# Patient Record
Sex: Male | Born: 1980 | Race: Black or African American | Hispanic: No | Marital: Single | Smoking: Former smoker
Health system: Southern US, Community
[De-identification: ages and names within clinical notes are randomized; demographics above are authoritative.]

## PROBLEM LIST (undated history)

## (undated) DIAGNOSIS — Z933 Colostomy status: Secondary | ICD-10-CM

## (undated) DIAGNOSIS — K5732 Diverticulitis of large intestine without perforation or abscess without bleeding: Secondary | ICD-10-CM

## (undated) DIAGNOSIS — Z789 Other specified health status: Secondary | ICD-10-CM

---

## 2013-02-13 ENCOUNTER — Ambulatory Visit: Payer: Self-pay | Admitting: Family Medicine

## 2013-03-31 ENCOUNTER — Ambulatory Visit: Payer: Self-pay | Admitting: Emergency Medicine

## 2013-04-06 ENCOUNTER — Ambulatory Visit: Payer: Self-pay | Admitting: Emergency Medicine

## 2022-03-04 ENCOUNTER — Inpatient Hospital Stay
Admission: EM | Admit: 2022-03-04 | Discharge: 2022-03-16 | DRG: 853 | Disposition: A | Discharge status: Home Health | Payer: Commercial Managed Care - PPO | Attending: Surgery | Admitting: Surgery

## 2022-03-04 ENCOUNTER — Emergency Department: Payer: Commercial Managed Care - PPO

## 2022-03-04 ENCOUNTER — Other Ambulatory Visit: Payer: Self-pay

## 2022-03-04 DIAGNOSIS — R1031 Right lower quadrant pain: Secondary | ICD-10-CM | POA: Diagnosis not present

## 2022-03-04 DIAGNOSIS — K469 Unspecified abdominal hernia without obstruction or gangrene: Secondary | ICD-10-CM | POA: Diagnosis present

## 2022-03-04 DIAGNOSIS — N179 Acute kidney failure, unspecified: Secondary | ICD-10-CM | POA: Diagnosis not present

## 2022-03-04 DIAGNOSIS — E876 Hypokalemia: Secondary | ICD-10-CM | POA: Diagnosis not present

## 2022-03-04 DIAGNOSIS — F17210 Nicotine dependence, cigarettes, uncomplicated: Secondary | ICD-10-CM | POA: Diagnosis present

## 2022-03-04 DIAGNOSIS — R1084 Generalized abdominal pain: Principal | ICD-10-CM

## 2022-03-04 DIAGNOSIS — K5720 Diverticulitis of large intestine with perforation and abscess without bleeding: Secondary | ICD-10-CM | POA: Diagnosis present

## 2022-03-04 DIAGNOSIS — K5732 Diverticulitis of large intestine without perforation or abscess without bleeding: Secondary | ICD-10-CM

## 2022-03-04 DIAGNOSIS — E871 Hypo-osmolality and hyponatremia: Secondary | ICD-10-CM | POA: Diagnosis not present

## 2022-03-04 DIAGNOSIS — K567 Ileus, unspecified: Secondary | ICD-10-CM | POA: Diagnosis not present

## 2022-03-04 DIAGNOSIS — K56600 Partial intestinal obstruction, unspecified as to cause: Secondary | ICD-10-CM | POA: Diagnosis present

## 2022-03-04 DIAGNOSIS — R188 Other ascites: Secondary | ICD-10-CM | POA: Diagnosis present

## 2022-03-04 DIAGNOSIS — K562 Volvulus: Secondary | ICD-10-CM | POA: Diagnosis present

## 2022-03-04 DIAGNOSIS — A4151 Sepsis due to Escherichia coli [E. coli]: Secondary | ICD-10-CM | POA: Diagnosis not present

## 2022-03-04 DIAGNOSIS — K651 Peritoneal abscess: Secondary | ICD-10-CM | POA: Diagnosis present

## 2022-03-04 LAB — COMPREHENSIVE METABOLIC PANEL
ALT: 30 U/L (ref 0–44)
AST: 23 U/L (ref 15–41)
Albumin: 4.3 g/dL (ref 3.5–5.0)
Alkaline Phosphatase: 55 U/L (ref 38–126)
Anion gap: 8 (ref 5–15)
BUN: 12 mg/dL (ref 6–20)
CO2: 25 mmol/L (ref 22–32)
Calcium: 9.4 mg/dL (ref 8.9–10.3)
Chloride: 101 mmol/L (ref 98–111)
Creatinine, Ser: 1.04 mg/dL (ref 0.61–1.24)
GFR, Estimated: >60 mL/min (ref >60)
Glucose, Bld: 158 mg/dL — ABNORMAL HIGH (ref 70–99)
Potassium: 3.7 mmol/L (ref 3.5–5.1)
Sodium: 134 mmol/L — ABNORMAL LOW (ref 135–145)
Total Bilirubin: 1.5 mg/dL — ABNORMAL HIGH (ref 0.3–1.2)
Total Protein: 9.1 g/dL — ABNORMAL HIGH (ref 6.5–8.1)

## 2022-03-04 LAB — CBC
HCT: 45.2 % (ref 39.0–52.0)
Hemoglobin: 14.8 g/dL (ref 13.0–17.0)
MCH: 30.4 pg (ref 26.0–34.0)
MCHC: 32.7 g/dL (ref 30.0–36.0)
MCV: 92.8 fL (ref 80.0–100.0)
Platelets: 379 10*3/uL (ref 150–400)
RBC: 4.87 MIL/uL (ref 4.22–5.81)
RDW: 11.6 % (ref 11.5–15.5)
WBC: 17.4 10*3/uL — ABNORMAL HIGH (ref 4.0–10.5)
nRBC: 0 % (ref 0.0–0.2)

## 2022-03-04 LAB — LIPASE, BLOOD: Lipase: 22 U/L (ref 11–51)

## 2022-03-04 MED ORDER — ONDANSETRON HCL 4 MG/2ML IJ SOLN
4.0000 mg | Freq: Once | INTRAMUSCULAR | Status: AC
  Administered 2022-03-04: 4 mg via INTRAVENOUS
  Filled 2022-03-04: qty 2

## 2022-03-04 MED ORDER — MORPHINE SULFATE (PF) 4 MG/ML IV SOLN
4.0000 mg | Freq: Once | INTRAVENOUS | Status: AC
  Administered 2022-03-04: 4 mg via INTRAVENOUS
  Filled 2022-03-04: qty 1

## 2022-03-04 MED ORDER — SODIUM CHLORIDE 0.9 % IV BOLUS
1000.0000 mL | Freq: Once | INTRAVENOUS | Status: AC
  Administered 2022-03-04: 1000 mL via INTRAVENOUS

## 2022-03-04 MED ORDER — IOHEXOL 300 MG/ML  SOLN
100.0000 mL | Freq: Once | INTRAMUSCULAR | Status: AC | PRN
  Administered 2022-03-05: 100 mL via INTRAVENOUS

## 2022-03-04 NOTE — ED Provider Notes (Signed)
? ?[facility] ?Provider Note ? ? ? Event Date/Time  ? First MD Initiated Contact with Patient 03/04/22 2311   ?  (approximate) ? ? ?History  ? ?Abdominal Pain ? ? ?HPI ? ?Erik Johns is a 41 y.o. male  who presents to the ED from home with a chief complaint of abdominal pain x 3 days. Reports generalized abdominal pain without associated nausea, vomiting or diarrhea. Reports constipation x several days. Denies fever, cough, chest pain, shortness of breath, dysuria, testicular pain or swelling.  ? ?  ? ? ?Past Medical History  ?History reviewed. No pertinent past medical history. ? ? ?Active Problem List  ? ?Patient Active Problem List  ? Diagnosis Date Noted  ? Diverticulitis of large intestine with perforation 03/05/2022  ? ? ? ?Past Surgical History  ?History reviewed. No pertinent surgical history. ? ? ?Home Medications  ? ?Prior to Admission medications   ?Not on File  ? ? ? ?Allergies  ?Patient has no known allergies. ? ? ?Family History  ?History reviewed. No pertinent family history. ? ? ?Physical Exam  ?Triage Vital Signs: ?ED Triage Vitals [03/04/22 2210]  ?Enc Vitals Group  ?   BP 122/83  ?   Pulse Rate (!) 113  ?   Resp 17  ?   Temp 99.1 ?F (37.3 ?C)  ?   Temp Source Oral  ?   SpO2 95 %  ?   Weight 230 lb (104.3 kg)  ?   Height 6' (1.829 m)  ?   Head Circumference   ?   Peak Flow   ?   Pain Score 9  ?   Pain Loc   ?   Pain Edu?   ?   Excl. in [place]?   ? ? ?Updated Vital Signs: ?BP (!) 132/110   Pulse (!) 119   Temp 99.1 ?F (37.3 ?C) (Oral)   Resp 20   Ht 6' (1.829 m)   Wt 104.3 kg   SpO2 95%   BMI 31.19 kg/m?  ? ? ?General: Awake, mild distress.  ?CV:  Tachycardic. Good peripheral perfusion.  ?Resp:  Normal effort. CTAB. ?Abd:  Mild diffuse tenderness to palpation without rebound or guarding. No distention.  ?Other:  Appears pale. ? ? ?ED Results / Procedures / Treatments  ?Labs ?(all labs ordered are listed, but only abnormal results are displayed) ?Labs Reviewed   ?COMPREHENSIVE METABOLIC PANEL - Abnormal; Notable for the following components:  ?    Result Value  ? Sodium 134 (*)   ? Glucose, Bld 158 (*)   ? Total Protein 9.1 (*)   ? Total Bilirubin 1.5 (*)   ? All other components within normal limits  ?CBC - Abnormal; Notable for the following components:  ? WBC 17.4 (*)   ? All other components within normal limits  ?URINALYSIS, ROUTINE W REFLEX MICROSCOPIC - Abnormal; Notable for the following components:  ? Color, Urine YELLOW (*)   ? APPearance CLEAR (*)   ? Specific Gravity, Urine >1.046 (*)   ? Hgb urine dipstick MODERATE (*)   ? Leukocytes,Ua SMALL (*)   ? Bacteria, UA RARE (*)   ? All other components within normal limits  ?LIPASE, BLOOD  ?HIV ANTIBODY (ROUTINE TESTING W REFLEX)  ?BASIC METABOLIC PANEL  ?MAGNESIUM  ?CBC WITH DIFFERENTIAL/PLATELET  ? ? ? ?EKG ? ?None ? ? ?RADIOLOGY ?I have independently visualized and interpreted patient's CT as well as noted the radiology interpretation: ? ?CT abd/pel:  Perforated diverticulitis ? ?Official radiology report(s): ?CT Abdomen Pelvis W Contrast ? ?Result Date: 03/05/2022 ?CLINICAL DATA:  Abdominal pain, acute, nonlocalized. EXAM: CT ABDOMEN AND PELVIS WITH CONTRAST TECHNIQUE: Multidetector CT imaging of the abdomen and pelvis was performed using the standard protocol following bolus administration of intravenous contrast. RADIATION DOSE REDUCTION: This exam was performed according to the departmental dose-optimization program which includes automated exposure control, adjustment of the mA and/or kV according to patient size and/or use of iterative reconstruction technique. CONTRAST:  157m OMNIPAQUE IOHEXOL 300 MG/ML  SOLN COMPARISON:  None. FINDINGS: Lower chest: No acute abnormality. Hepatobiliary: No focal liver abnormality is seen. No gallstones, gallbladder wall thickening, or biliary dilatation. Pancreas: Unremarkable Spleen: Unremarkable Adrenals/Urinary Tract: The adrenal glands are unremarkable. The kidneys are  normal in size and position. Multiple cortical hypodensities are seen within the right kidney which are too small to accurately characterize but likely represent multiple tiny cortical cysts. The kidneys are otherwise unremarkable. The bladder is unremarkable. Stomach/Bowel: There is extensive pericolonic inflammatory stranding and marked focal bowel wall thickening involving the proximal sigmoid colon within the left lower quadrant of the abdomen best seen on axial image # 63/2 in keeping with changes of acute diverticulitis. There is a are multiple foci of extraluminal gas seen within the left lower quadrant in keeping with perforated diverticulitis. No loculated pericolonic fluid collection. Trace free fluid within the pelvis. No gross free intraperitoneal gas. No evidence of obstruction. Mild infiltration of the a colonic mesentery and enhancement of the peritoneal lining within the left lower quadrant may reflect changes of developing peritonitis. Stomach, small bowel, and large bowel are otherwise unremarkable. Appendix normal. Vascular/Lymphatic: The abdominal vasculature is unremarkable. Shotty adenopathy within the adjacent sigmoid mesentery is likely reactive in nature. No pathologic adenopathy within the abdomen and pelvis. Reproductive: Prostate is unremarkable. Other: No abdominal wall hernia. Musculoskeletal: No acute bone abnormality. No lytic or blastic bone lesion. Lobulated subcutaneous fluid superficial to the sacrum may reflect the sequela of prior trauma or surgical intervention or may reflect a a developing sacral decubitus wound. IMPRESSION: Perforated sigmoid diverticulitis. Peritoneal enhancement and mesenteric infiltration may reflect changes of developing peritonitis. Small free fluid within the pelvis. No loculated pericolonic fluid collections, gross free intraperitoneal gas, or evidence of obstruction. Lobulated subcutaneous fluid superficial to the sacrum of unclear significance.  Correlation for history of trauma or surgical intervention may be helpful. Alternatively, this may reflect a developing sacral decubitus wound. Electronically Signed   By: AFidela SalisburyM.D.   On: 03/05/2022 00:24   ? ? ?PROCEDURES: ? ?Critical Care performed: No ? ?Procedures ? ? ?MEDICATIONS ORDERED IN ED: ?Medications  ?lactated ringers infusion (125 mL/hr Intravenous New Bag/Given 03/05/22 0218)  ?ketorolac (TORADOL) 30 MG/ML injection 30 mg (30 mg Intravenous Given 03/05/22 0219)  ?HYDROmorphone (DILAUDID) injection 0.5 mg (has no administration in time range)  ?ondansetron (ZOFRAN-ODT) disintegrating tablet 4 mg (has no administration in time range)  ?  Or  ?ondansetron (ZOFRAN) injection 4 mg (has no administration in time range)  ?pantoprazole (PROTONIX) injection 40 mg (40 mg Intravenous Given 03/05/22 0213)  ?enoxaparin (LOVENOX) injection 52.5 mg (has no administration in time range)  ?piperacillin-tazobactam (ZOSYN) IVPB 3.375 g (has no administration in time range)  ?sodium chloride 0.9 % bolus 1,000 mL (0 mLs Intravenous Stopped 03/05/22 0232)  ?ondansetron ([facility] injection 4 mg (4 mg Intravenous Given 03/04/22 2350)  ?morphine (PF) 4 MG/ML injection 4 mg (4 mg Intravenous Given 03/04/22 2352)  ?iohexol (OMNIPAQUE) 300  MG/ML solution 100 mL (100 mLs Intravenous Contrast Given 03/05/22 0006)  ?piperacillin-tazobactam (ZOSYN) IVPB 3.375 g (0 g Intravenous Stopped 03/05/22 0224)  ? ? ? ?IMPRESSION / MDM / ASSESSMENT AND PLAN / ED COURSE  ?I reviewed the triage vital signs and the nursing notes. ?             ?               ?41 year old male here for abdominal pain and constipation. Differential diagnosis includes, but is not limited to, acute appendicitis, renal colic, testicular torsion, urinary tract infection/pyelonephritis, prostatitis,  epididymitis, diverticulitis, small bowel obstruction or ileus, colitis, abdominal aortic aneurysm, gastroenteritis, hernia, etc. I have personally reviewed patient's  records and he does not have any to review other than radiology studies from 2014. ? ?Laboratory results demonstrate leukocytosis WBC 17.4, tbili 1.5. Will administer IV analgesia, antiemetic and obtain CT abd/pel. ? ?Clinical

## 2022-03-04 NOTE — ED Triage Notes (Signed)
Pt states his abd has hurt all weekend but worsened tonight. Pt states he has not had a BM in a few days. Pt denies any urinary symptoms.  ?

## 2022-03-04 NOTE — ED Notes (Signed)
Pt to ED for Generalized cramping abdominal pain that has started 2-3 days ago. Denies N/V/D. Denies urinary symptoms.States he has pain in lower back that wraps around sides. Pt states he has not been able to eat or drink anything due to abdominal pain. Denies recent fevers.  ? ?Pt is A&Ox4.  ?

## 2022-03-05 DIAGNOSIS — R1031 Right lower quadrant pain: Secondary | ICD-10-CM | POA: Diagnosis present

## 2022-03-05 DIAGNOSIS — R188 Other ascites: Secondary | ICD-10-CM | POA: Diagnosis present

## 2022-03-05 DIAGNOSIS — K562 Volvulus: Secondary | ICD-10-CM | POA: Diagnosis present

## 2022-03-05 DIAGNOSIS — F17210 Nicotine dependence, cigarettes, uncomplicated: Secondary | ICD-10-CM | POA: Diagnosis present

## 2022-03-05 DIAGNOSIS — K5720 Diverticulitis of large intestine with perforation and abscess without bleeding: Secondary | ICD-10-CM

## 2022-03-05 DIAGNOSIS — K5732 Diverticulitis of large intestine without perforation or abscess without bleeding: Secondary | ICD-10-CM

## 2022-03-05 DIAGNOSIS — A4151 Sepsis due to Escherichia coli [E. coli]: Secondary | ICD-10-CM | POA: Diagnosis present

## 2022-03-05 DIAGNOSIS — K469 Unspecified abdominal hernia without obstruction or gangrene: Secondary | ICD-10-CM | POA: Diagnosis present

## 2022-03-05 DIAGNOSIS — K651 Peritoneal abscess: Secondary | ICD-10-CM | POA: Diagnosis present

## 2022-03-05 DIAGNOSIS — K56600 Partial intestinal obstruction, unspecified as to cause: Secondary | ICD-10-CM | POA: Diagnosis present

## 2022-03-05 DIAGNOSIS — K567 Ileus, unspecified: Secondary | ICD-10-CM | POA: Diagnosis not present

## 2022-03-05 DIAGNOSIS — E871 Hypo-osmolality and hyponatremia: Secondary | ICD-10-CM | POA: Diagnosis not present

## 2022-03-05 DIAGNOSIS — E876 Hypokalemia: Secondary | ICD-10-CM | POA: Diagnosis not present

## 2022-03-05 DIAGNOSIS — N179 Acute kidney failure, unspecified: Secondary | ICD-10-CM | POA: Diagnosis not present

## 2022-03-05 LAB — CBC WITH DIFFERENTIAL/PLATELET
Abs Immature Granulocytes: 0.24 10*3/uL — ABNORMAL HIGH (ref 0.00–0.07)
Basophils Absolute: 0.1 10*3/uL (ref 0.0–0.1)
Basophils Relative: 0 %
Eosinophils Absolute: 0 10*3/uL (ref 0.0–0.5)
Eosinophils Relative: 0 %
HCT: 42.1 % (ref 39.0–52.0)
Hemoglobin: 13.4 g/dL (ref 13.0–17.0)
Immature Granulocytes: 1 %
Lymphocytes Relative: 7 %
Lymphs Abs: 1.8 10*3/uL (ref 0.7–4.0)
MCH: 30.4 pg (ref 26.0–34.0)
MCHC: 31.8 g/dL (ref 30.0–36.0)
MCV: 95.5 fL (ref 80.0–100.0)
Monocytes Absolute: 1.1 10*3/uL — ABNORMAL HIGH (ref 0.1–1.0)
Monocytes Relative: 4 %
Neutro Abs: 23.4 10*3/uL — ABNORMAL HIGH (ref 1.7–7.7)
Neutrophils Relative %: 88 %
Platelets: 336 10*3/uL (ref 150–400)
RBC: 4.41 MIL/uL (ref 4.22–5.81)
RDW: 11.5 % (ref 11.5–15.5)
Smear Review: NORMAL
WBC: 26.6 10*3/uL — ABNORMAL HIGH (ref 4.0–10.5)
nRBC: 0 % (ref 0.0–0.2)

## 2022-03-05 LAB — BASIC METABOLIC PANEL
Anion gap: 8 (ref 5–15)
BUN: 12 mg/dL (ref 6–20)
CO2: 26 mmol/L (ref 22–32)
Calcium: 8.5 mg/dL — ABNORMAL LOW (ref 8.9–10.3)
Chloride: 101 mmol/L (ref 98–111)
Creatinine, Ser: 1.26 mg/dL — ABNORMAL HIGH (ref 0.61–1.24)
GFR, Estimated: >60 mL/min (ref >60)
Glucose, Bld: 134 mg/dL — ABNORMAL HIGH (ref 70–99)
Potassium: 4.1 mmol/L (ref 3.5–5.1)
Sodium: 135 mmol/L (ref 135–145)

## 2022-03-05 LAB — URINALYSIS, ROUTINE W REFLEX MICROSCOPIC
Bilirubin Urine: NEGATIVE
Glucose, UA: NEGATIVE mg/dL
Ketones, ur: NEGATIVE mg/dL
Nitrite: NEGATIVE
Protein, ur: NEGATIVE mg/dL
Specific Gravity, Urine: >1.046 — ABNORMAL HIGH (ref 1.005–1.030)
WBC, UA: NONE SEEN WBC/hpf (ref 0–5)
pH: 5 (ref 5.0–8.0)

## 2022-03-05 LAB — MAGNESIUM: Magnesium: 2.1 mg/dL (ref 1.7–2.4)

## 2022-03-05 LAB — SURGICAL PCR SCREEN
MRSA, PCR: NEGATIVE
Staphylococcus aureus: NEGATIVE

## 2022-03-05 LAB — HIV ANTIBODY (ROUTINE TESTING W REFLEX): HIV Screen 4th Generation wRfx: NONREACTIVE

## 2022-03-05 MED ORDER — ONDANSETRON HCL 4 MG/2ML IJ SOLN: 4.0000 mg | Freq: Four times a day (QID) | INTRAMUSCULAR | Status: DC | PRN

## 2022-03-05 MED ORDER — LACTATED RINGERS IV SOLN
125.0000 mL/h | INTRAVENOUS | Status: DC
  Administered 2022-03-05 (×2): 125 mL/h via INTRAVENOUS

## 2022-03-05 MED ORDER — MUPIROCIN 2 % EX OINT
1.0000 "application " | TOPICAL_OINTMENT | Freq: Two times a day (BID) | CUTANEOUS | Status: DC
  Filled 2022-03-05: qty 22

## 2022-03-05 MED ORDER — PIPERACILLIN-TAZOBACTAM 3.375 G IVPB 30 MIN
3.3750 g | Freq: Once | INTRAVENOUS | Status: AC
  Administered 2022-03-05: 3.375 g via INTRAVENOUS
  Filled 2022-03-05: qty 50

## 2022-03-05 MED ORDER — HYDROMORPHONE HCL 1 MG/ML IJ SOLN
0.5000 mg | INTRAMUSCULAR | Status: DC | PRN
  Administered 2022-03-06 – 2022-03-11 (×17): 0.5 mg via INTRAVENOUS
  Filled 2022-03-05 (×17): qty 0.5

## 2022-03-05 MED ORDER — ENOXAPARIN SODIUM 60 MG/0.6ML IJ SOSY
0.5000 mg/kg | PREFILLED_SYRINGE | INTRAMUSCULAR | Status: DC
  Administered 2022-03-06 – 2022-03-07 (×2): 52.5 mg via SUBCUTANEOUS
  Filled 2022-03-05 (×2): qty 0.6

## 2022-03-05 MED ORDER — PIPERACILLIN-TAZOBACTAM 3.375 G IVPB
3.3750 g | Freq: Three times a day (TID) | INTRAVENOUS | Status: AC
  Administered 2022-03-05 – 2022-03-12 (×21): 3.375 g via INTRAVENOUS
  Filled 2022-03-05 (×20): qty 50

## 2022-03-05 MED ORDER — ONDANSETRON 4 MG PO TBDP
4.0000 mg | ORAL_TABLET | Freq: Four times a day (QID) | ORAL | Status: DC | PRN
  Filled 2022-03-05: qty 1

## 2022-03-05 MED ORDER — PANTOPRAZOLE SODIUM 40 MG IV SOLR
40.0000 mg | Freq: Every day | INTRAVENOUS | Status: DC
  Administered 2022-03-05 – 2022-03-15 (×12): 40 mg via INTRAVENOUS
  Filled 2022-03-05 (×12): qty 10

## 2022-03-05 MED ORDER — KETOROLAC TROMETHAMINE 30 MG/ML IJ SOLN
30.0000 mg | Freq: Four times a day (QID) | INTRAMUSCULAR | Status: AC
  Administered 2022-03-05 – 2022-03-09 (×17): 30 mg via INTRAVENOUS
  Filled 2022-03-05 (×17): qty 1

## 2022-03-05 MED ORDER — SODIUM CHLORIDE 0.9 % IV SOLN: INTRAVENOUS | Status: DC

## 2022-03-05 MED ORDER — SODIUM CHLORIDE 0.9 % IV BOLUS
1000.0000 mL | Freq: Once | INTRAVENOUS | Status: AC
  Administered 2022-03-05: 1000 mL via INTRAVENOUS

## 2022-03-05 MED ORDER — ACETAMINOPHEN 500 MG PO TABS
1000.0000 mg | ORAL_TABLET | Freq: Four times a day (QID) | ORAL | Status: DC | PRN
Start: 2022-03-05 — End: 2022-03-08
  Administered 2022-03-05 – 2022-03-08 (×6): 1000 mg via ORAL
  Filled 2022-03-05 (×6): qty 2

## 2022-03-05 NOTE — Progress Notes (Signed)
Admission profile updated. ?

## 2022-03-05 NOTE — Progress Notes (Signed)
03/05/2022 ? ?Subjective: ?No acute events.  Patient reports that his pain is somewhat improved.  Denies any nausea or vomiting.  On labwork, his WBC has increased to 26.6 and his Cr has increased to 1.26.   ? ?Vital signs: ?Temp:  [99.1 ?F (37.3 ?C)] 99.1 ?F (37.3 ?C) (03/19 2210) ?Pulse Rate:  [105-121] 110 (03/20 0430) ?Resp:  [17-22] 20 (03/20 0200) ?BP: (115-132)/(69-110) 132/80 (03/20 0430) ?SpO2:  [93 %-97 %] 94 % (03/20 0430) ?Weight:  [104.3 kg] 104.3 kg (03/19 2210)  ? ?Intake/Output: ?No intake/output data recorded. ?  ? ?Physical Exam: ?Constitutional: No acute distress ?Abdomen:  soft, non-distended, with mild tenderness in LLQ.  No worsening since last seen on admission.  No peritonitis. ? ?Labs:  ?Recent Labs  ?  03/04/22 ?2214 03/05/22 ?5366  ?WBC 17.4* 26.6*  ?HGB 14.8 13.4  ?HCT 45.2 42.1  ?PLT 379 336  ? ?Recent Labs  ?  03/04/22 ?2214 03/05/22 ?4403  ?NA 134* 135  ?K 3.7 4.1  ?CL 101 101  ?CO2 25 26  ?GLUCOSE 158* 134*  ?BUN 12 12  ?CREATININE 1.04 1.26*  ?CALCIUM 9.4 8.5*  ? ?No results for input(s): LABPROT, INR in the last 72 hours. ? ?Imaging: ?CT Abdomen Pelvis W Contrast ? ?Result Date: 03/05/2022 ?CLINICAL DATA:  Abdominal pain, acute, nonlocalized. EXAM: CT ABDOMEN AND PELVIS WITH CONTRAST TECHNIQUE: Multidetector CT imaging of the abdomen and pelvis was performed using the standard protocol following bolus administration of intravenous contrast. RADIATION DOSE REDUCTION: This exam was performed according to the departmental dose-optimization program which includes automated exposure control, adjustment of the mA and/or kV according to patient size and/or use of iterative reconstruction technique. CONTRAST:  165m OMNIPAQUE IOHEXOL 300 MG/ML  SOLN COMPARISON:  None. FINDINGS: Lower chest: No acute abnormality. Hepatobiliary: No focal liver abnormality is seen. No gallstones, gallbladder wall thickening, or biliary dilatation. Pancreas: Unremarkable Spleen: Unremarkable Adrenals/Urinary  Tract: The adrenal glands are unremarkable. The kidneys are normal in size and position. Multiple cortical hypodensities are seen within the right kidney which are too small to accurately characterize but likely represent multiple tiny cortical cysts. The kidneys are otherwise unremarkable. The bladder is unremarkable. Stomach/Bowel: There is extensive pericolonic inflammatory stranding and marked focal bowel wall thickening involving the proximal sigmoid colon within the left lower quadrant of the abdomen best seen on axial image # 63/2 in keeping with changes of acute diverticulitis. There is a are multiple foci of extraluminal gas seen within the left lower quadrant in keeping with perforated diverticulitis. No loculated pericolonic fluid collection. Trace free fluid within the pelvis. No gross free intraperitoneal gas. No evidence of obstruction. Mild infiltration of the a colonic mesentery and enhancement of the peritoneal lining within the left lower quadrant may reflect changes of developing peritonitis. Stomach, small bowel, and large bowel are otherwise unremarkable. Appendix normal. Vascular/Lymphatic: The abdominal vasculature is unremarkable. Shotty adenopathy within the adjacent sigmoid mesentery is likely reactive in nature. No pathologic adenopathy within the abdomen and pelvis. Reproductive: Prostate is unremarkable. Other: No abdominal wall hernia. Musculoskeletal: No acute bone abnormality. No lytic or blastic bone lesion. Lobulated subcutaneous fluid superficial to the sacrum may reflect the sequela of prior trauma or surgical intervention or may reflect a a developing sacral decubitus wound. IMPRESSION: Perforated sigmoid diverticulitis. Peritoneal enhancement and mesenteric infiltration may reflect changes of developing peritonitis. Small free fluid within the pelvis. No loculated pericolonic fluid collections, gross free intraperitoneal gas, or evidence of obstruction. Lobulated subcutaneous  fluid superficial  to the sacrum of unclear significance. Correlation for history of trauma or surgical intervention may be helpful. Alternatively, this may reflect a developing sacral decubitus wound. Electronically Signed   By: Fidela Salisbury M.D.   On: 03/05/2022 00:24   ? ?Assessment/Plan: ?This is a 41 y.o. male with perforated diverticulitis with small bubbles of gas. ? ?--Clinically patient feels better with mildly improved pain rather than worsening given his increased WBC.  For now, will continue our plan of conservative measures.  Will give 1L NS bolus given his increased Cr to hydrate him better.   ?--Continue NPO for now, IV fluids, IV abx.  Discussed with him the possibility of clear liquids tonight if he continues to improve clinically. ?--GI/DVT prophylaxis. ? ? ?I spent 25 minutes dedicated to the care of this patient on the date of this encounter to include pre-visit review of records, face-to-face time with the patient discussing diagnosis and management, and any post-visit coordination of care. ? ?Melvyn Neth, MD ?[facility]  ?

## 2022-03-05 NOTE — Progress Notes (Signed)
Anticoagulation monitoring(Lovenox): ? ?41 yo  male ordered Lovenox 40 mg Q24h ?   ?Filed Weights  ? 03/04/22 2210  ?Weight: 104.3 kg (230 lb)  ? ?BMI 31  ? ?Lab Results  ?Component Value Date  ? CREATININE 1.04 03/04/2022  ? ?Estimated Creatinine Clearance: 119.1 mL/min (by C-G formula based on SCr of 1.04 mg/dL). ?Hemoglobin & Hematocrit  ?   ?Component Value Date/Time  ? HGB 14.8 03/04/2022 2214  ? HCT 45.2 03/04/2022 2214  ? ? ? ?Per Protocol for Patient with estCrcl > 30 ml/min and BMI > 30, will transition to Lovenox 52.5  mg Q24h.  ?  ? ? ?

## 2022-03-05 NOTE — Progress Notes (Signed)
?   03/05/22 1123  ?Assess: MEWS Score  ?Temp (!) 102.9 ?F (39.4 ?C)  ?BP 121/81  ?Pulse Rate (!) 130  ?Resp 20  ?SpO2 96 %  ?Assess: MEWS Score  ?MEWS Temp 2  ?MEWS Systolic 0  ?MEWS Pulse 3  ?MEWS RR 0  ?MEWS LOC 0  ?MEWS Score 5  ?MEWS Score Color Red  ?Assess: if the MEWS score is Yellow or Red  ?Were vital signs taken at a resting state? No  ?Focused Assessment No change from prior assessment  ?Does the patient meet 2 or more of the SIRS criteria? No  ?MEWS guidelines implemented *See Row Information* Yes  ?Treat  ?MEWS Interventions Administered prn meds/treatments  ?Pain Scale 0-10  ?Pain Score 3  ?Pain Type Acute pain  ?Pain Location Abdomen  ?Pain Orientation Left  ?Pain Descriptors / Indicators Aching  ?Pain Onset With Activity  ?Take Vital Signs  ?Increase Vital Sign Frequency  Red: Q 1hr X 4 then Q 4hr X 4, if remains red, continue Q 4hrs  ?Escalate  ?MEWS: Escalate Red: discuss with charge nurse/RN and provider, consider discussing with RRT  ?Notify: Charge Nurse/RN  ?Name of Charge Nurse/RN Notified Ladon Vandenberghe/  ?Date Charge Nurse/RN Notified 03/05/22  ?Time Charge Nurse/RN Notified 1123  ?Notify: Provider  ?Provider Name/Title Dr. Hampton Abbot  ?Date Provider Notified 03/05/22  ?Time Provider Notified 1130  ?Notification Type Page  ?Notification Reason Change in status  ?Provider response See new orders  ?Date of Provider Response 03/05/22  ?Time of Provider Response 1135  ?Notify: Rapid Response  ?Name of Rapid Response RN Notified Clarise Cruz  ?Date Rapid Response Notified 03/05/22  ?Time Rapid Response Notified 1130  ?Assess: SIRS CRITERIA  ?SIRS Temperature  1  ?SIRS Pulse 1  ?SIRS Respirations  0  ?SIRS WBC 0  ?SIRS Score Sum  2  ? ?Tylenol given and fluids started vitals to be retaken at 1230 ?

## 2022-03-05 NOTE — H&P (Signed)
?Date of Admission:  03/05/2022 ? ?Reason for Admission:  Perforated sigmoid diverticulitis ? ?History of Present Illness: ?Erik Johns is a 41 y.o. male presenting with a 3 day history of generalized cramping abdominal pain.  The patient noticed some mild pain in the left lower quadrant starting the night of 03/02/22.  He went to work and thought that he might be constipated.  He has had some issues with constipation in the past and taking a laxative has helped.  He had liquid bowel movements but his pain did not resolve.  On 3/18, the pain remained similar, but on the night of 3/19, his pain became worse.  He went to work again, but ended up coming to the ER due to worsening pain.  Denies any fevers but reports feeling chills.  Denies any nausea or vomiting, chest pain, shortness of breath.  In the ER, his workup showed a WBC of 17.4.  He had a CT scan of abdomen/pelvis which showed perforated sigmoid diverticulitis, with inflammatory changes of the sigmoid colon with a few bubbles of gas in pericolonic region.  No diffuse free air, no abscess.  I have personally viewed the images.  The patient denies any prior episodes. ? ?Past Medical History: ?--No past medical history  ? ?Past Surgical History: ?--No past surgical history ? ?Home Medications: ?Prior to Admission medications   ?None  ? ? ?Allergies: ?No Known Allergies ? ?Social History: ? reports that he has been smoking cigarettes. He has been smoking an average of .5 packs per day. He has never used smokeless tobacco. He reports current alcohol use. He reports that he does not use drugs.  ? ?Family History: ?History reviewed. No pertinent family history. ? ?Review of Systems: ?Review of Systems  ?Constitutional:  Positive for chills. Negative for fever.  ?HENT:  Negative for hearing loss.   ?Respiratory:  Negative for shortness of breath.   ?Cardiovascular:  Negative for chest pain.  ?Gastrointestinal:  Positive for abdominal pain and constipation.  Negative for diarrhea, nausea and vomiting.  ?Genitourinary:  Negative for dysuria.  ?Musculoskeletal:  Negative for myalgias.  ?Skin:  Negative for rash.  ?Neurological:  Negative for dizziness.  ?Psychiatric/Behavioral:  Negative for depression.   ? ?Physical Exam ?BP (!) 127/95 (BP Location: Right Arm)   Pulse (!) 105   Temp 99.1 ?F (37.3 ?C) (Oral)   Resp (!) 22   Ht 6' (1.829 m)   Wt 104.3 kg   SpO2 97%   BMI 31.19 kg/m?  ?CONSTITUTIONAL: No acute distress, well nourished. ?HEENT:  Normocephalic, atraumatic, extraocular motion intact. ?NECK: Trachea is midline, and there is no jugular venous distension.  ?RESPIRATORY:  Normal respiratory effort without pathologic use of accessory muscles. ?CARDIOVASCULAR: Regular rhythm and rate. ?GI: The abdomen is soft, nondistended, with tenderness to palpation in the left lower quadrant.  No peritonitis.  ?MUSCULOSKELETAL:  Normal muscle strength and tone in all four extremities.  No peripheral edema or cyanosis. ?SKIN: Skin turgor is normal. There are no pathologic skin lesions.  ?NEUROLOGIC:  Motor and sensation is grossly normal.  Cranial nerves are grossly intact. ?PSYCH:  Alert and oriented to person, place and time. Affect is normal. ? ?Laboratory Analysis: ?Results for orders placed or performed during the hospital encounter of 03/04/22 (from the past 24 hour(s))  ?Lipase, blood     Status: None  ? Collection Time: 03/04/22 10:14 PM  ?Result Value Ref Range  ? Lipase 22 11 - 51 U/L  ?Comprehensive metabolic panel  Status: Abnormal  ? Collection Time: 03/04/22 10:14 PM  ?Result Value Ref Range  ? Sodium 134 (L) 135 - 145 mmol/L  ? Potassium 3.7 3.5 - 5.1 mmol/L  ? Chloride 101 98 - 111 mmol/L  ? CO2 25 22 - 32 mmol/L  ? Glucose, Bld 158 (H) 70 - 99 mg/dL  ? BUN 12 6 - 20 mg/dL  ? Creatinine, Ser 1.04 0.61 - 1.24 mg/dL  ? Calcium 9.4 8.9 - 10.3 mg/dL  ? Total Protein 9.1 (H) 6.5 - 8.1 g/dL  ? Albumin 4.3 3.5 - 5.0 g/dL  ? AST 23 15 - 41 U/L  ? ALT 30 0 - 44  U/L  ? Alkaline Phosphatase 55 38 - 126 U/L  ? Total Bilirubin 1.5 (H) 0.3 - 1.2 mg/dL  ? GFR, Estimated >60 >60 mL/min  ? Anion gap 8 5 - 15  ?CBC     Status: Abnormal  ? Collection Time: 03/04/22 10:14 PM  ?Result Value Ref Range  ? WBC 17.4 (H) 4.0 - 10.5 K/uL  ? RBC 4.87 4.22 - 5.81 MIL/uL  ? Hemoglobin 14.8 13.0 - 17.0 g/dL  ? HCT 45.2 39.0 - 52.0 %  ? MCV 92.8 80.0 - 100.0 fL  ? MCH 30.4 26.0 - 34.0 pg  ? MCHC 32.7 30.0 - 36.0 g/dL  ? RDW 11.6 11.5 - 15.5 %  ? Platelets 379 150 - 400 K/uL  ? nRBC 0.0 0.0 - 0.2 %  ? ? ?Imaging: ?CT Abdomen Pelvis W Contrast ? ?Result Date: 03/05/2022 ?CLINICAL DATA:  Abdominal pain, acute, nonlocalized. EXAM: CT ABDOMEN AND PELVIS WITH CONTRAST TECHNIQUE: Multidetector CT imaging of the abdomen and pelvis was performed using the standard protocol following bolus administration of intravenous contrast. RADIATION DOSE REDUCTION: This exam was performed according to the departmental dose-optimization program which includes automated exposure control, adjustment of the mA and/or kV according to patient size and/or use of iterative reconstruction technique. CONTRAST:  113m OMNIPAQUE IOHEXOL 300 MG/ML  SOLN COMPARISON:  None. FINDINGS: Lower chest: No acute abnormality. Hepatobiliary: No focal liver abnormality is seen. No gallstones, gallbladder wall thickening, or biliary dilatation. Pancreas: Unremarkable Spleen: Unremarkable Adrenals/Urinary Tract: The adrenal glands are unremarkable. The kidneys are normal in size and position. Multiple cortical hypodensities are seen within the right kidney which are too small to accurately characterize but likely represent multiple tiny cortical cysts. The kidneys are otherwise unremarkable. The bladder is unremarkable. Stomach/Bowel: There is extensive pericolonic inflammatory stranding and marked focal bowel wall thickening involving the proximal sigmoid colon within the left lower quadrant of the abdomen best seen on axial image # 63/2 in  keeping with changes of acute diverticulitis. There is a are multiple foci of extraluminal gas seen within the left lower quadrant in keeping with perforated diverticulitis. No loculated pericolonic fluid collection. Trace free fluid within the pelvis. No gross free intraperitoneal gas. No evidence of obstruction. Mild infiltration of the a colonic mesentery and enhancement of the peritoneal lining within the left lower quadrant may reflect changes of developing peritonitis. Stomach, small bowel, and large bowel are otherwise unremarkable. Appendix normal. Vascular/Lymphatic: The abdominal vasculature is unremarkable. Shotty adenopathy within the adjacent sigmoid mesentery is likely reactive in nature. No pathologic adenopathy within the abdomen and pelvis. Reproductive: Prostate is unremarkable. Other: No abdominal wall hernia. Musculoskeletal: No acute bone abnormality. No lytic or blastic bone lesion. Lobulated subcutaneous fluid superficial to the sacrum may reflect the sequela of prior trauma or surgical intervention or may reflect  a a developing sacral decubitus wound. IMPRESSION: Perforated sigmoid diverticulitis. Peritoneal enhancement and mesenteric infiltration may reflect changes of developing peritonitis. Small free fluid within the pelvis. No loculated pericolonic fluid collections, gross free intraperitoneal gas, or evidence of obstruction. Lobulated subcutaneous fluid superficial to the sacrum of unclear significance. Correlation for history of trauma or surgical intervention may be helpful. Alternatively, this may reflect a developing sacral decubitus wound. Electronically Signed   By: Fidela Salisbury M.D.   On: 03/05/2022 00:24   ? ?Assessment and Plan: ?This is a 41 y.o. male with perforated sigmoid diverticulitis. ? ?- Discussed with the patient the findings on the lab work and imaging tonight.  CT scan does show perforated sigmoid diverticulitis with bubbles of free air in the adjacent area  however not diffusely spread.  Discussed with him the different degrees of diverticulitis ranging from only inflammatory changes to frank perforation with spillage of stool contents which requires emergency surgery.  Also

## 2022-03-06 ENCOUNTER — Inpatient Hospital Stay: Payer: Commercial Managed Care - PPO

## 2022-03-06 DIAGNOSIS — K5720 Diverticulitis of large intestine with perforation and abscess without bleeding: Secondary | ICD-10-CM | POA: Diagnosis not present

## 2022-03-06 LAB — CBC
HCT: 37.1 % — ABNORMAL LOW (ref 39.0–52.0)
Hemoglobin: 12.2 g/dL — ABNORMAL LOW (ref 13.0–17.0)
MCH: 30.9 pg (ref 26.0–34.0)
MCHC: 32.9 g/dL (ref 30.0–36.0)
MCV: 93.9 fL (ref 80.0–100.0)
Platelets: 278 10*3/uL (ref 150–400)
RBC: 3.95 MIL/uL — ABNORMAL LOW (ref 4.22–5.81)
RDW: 11.8 % (ref 11.5–15.5)
WBC: 28.1 10*3/uL — ABNORMAL HIGH (ref 4.0–10.5)
nRBC: 0 % (ref 0.0–0.2)

## 2022-03-06 LAB — BASIC METABOLIC PANEL
Anion gap: 7 (ref 5–15)
BUN: 17 mg/dL (ref 6–20)
CO2: 23 mmol/L (ref 22–32)
Calcium: 8.2 mg/dL — ABNORMAL LOW (ref 8.9–10.3)
Chloride: 107 mmol/L (ref 98–111)
Creatinine, Ser: 1.39 mg/dL — ABNORMAL HIGH (ref 0.61–1.24)
GFR, Estimated: >60 mL/min (ref >60)
Glucose, Bld: 117 mg/dL — ABNORMAL HIGH (ref 70–99)
Potassium: 3.3 mmol/L — ABNORMAL LOW (ref 3.5–5.1)
Sodium: 137 mmol/L (ref 135–145)

## 2022-03-06 LAB — MAGNESIUM: Magnesium: 2 mg/dL (ref 1.7–2.4)

## 2022-03-06 MED ORDER — POTASSIUM CHLORIDE 20 MEQ PO PACK
40.0000 meq | PACK | Freq: Two times a day (BID) | ORAL | Status: DC
  Administered 2022-03-06 – 2022-03-07 (×4): 40 meq via ORAL
  Filled 2022-03-06 (×4): qty 2

## 2022-03-06 MED ORDER — SODIUM CHLORIDE 0.9 % IV BOLUS
1000.0000 mL | Freq: Once | INTRAVENOUS | Status: AC
  Administered 2022-03-06: 1000 mL via INTRAVENOUS

## 2022-03-06 MED ORDER — ALBUMIN HUMAN 25 % IV SOLN
25.0000 g | Freq: Once | INTRAVENOUS | Status: AC
  Administered 2022-03-06: 25 g via INTRAVENOUS
  Filled 2022-03-06: qty 100

## 2022-03-06 NOTE — TOC Initial Note (Signed)
?  Transition of Care (TOC) Screening Note ? ? ?Patient Details  ?Name: Erik Johns ?Date of Birth: 01-05-1981 ? ? ?Transition of Care (TOC) CM/SW Contact:    ?Kerin Salen, RN ?Phone Number: ?03/06/2022, 9:21 AM ? ? ? ?Transition of Care Department [facility]) has reviewed patient and no TOC needs have been identified at this time. We will continue to monitor patient advancement through interdisciplinary progression rounds. If new patient transition needs arise, please place a TOC consult. ?              ? ? ?  ?  ? ? ?Patient Goals and CMS Choice ?  ?  ?  ? ?Expected Discharge Plan and Services ?  ?  ?  ?  ?  ?                ?  ?  ?  ?  ?  ?  ?  ?  ?  ?  ? ?Prior Living Arrangements/Services ?  ?  ?  ?       ?  ?  ?  ?  ? ?Activities of Daily Living ?Home Assistive Devices/Equipment: None ?ADL Screening (condition at time of admission) ?Patient's cognitive ability adequate to safely complete daily activities?: Yes ?Is the patient deaf or have difficulty hearing?: No ?Does the patient have difficulty seeing, even when wearing glasses/contacts?: No ?Does the patient have difficulty concentrating, remembering, or making decisions?: No ?Patient able to express need for assistance with ADLs?: Yes ?Does the patient have difficulty dressing or bathing?: No ?Independently performs ADLs?: Yes (appropriate for developmental age) ?Does the patient have difficulty walking or climbing stairs?: No ?Weakness of Legs: None ?Weakness of Arms/Hands: None ? ?Permission Sought/Granted ?  ?  ?   ?   ?   ?   ? ?Emotional Assessment ?  ?  ?  ?  ?  ?  ? ?Admission diagnosis:  Generalized abdominal pain [R10.84] ?Diverticulitis of colon with perforation [K57.20] ?Diverticulitis of large intestine with perforation [K57.20] ?Patient Active Problem List  ? Diagnosis Date Noted  ? Diverticulitis of colon with perforation 03/05/2022  ? ?PCP:  Pcp, No ?Pharmacy:  No Pharmacies Listed ? ? ? ?Social Determinants of Health (SDOH) Interventions ?   ? ?Readmission Risk Interventions ?No flowsheet data found. ? ? ?

## 2022-03-06 NOTE — Progress Notes (Signed)
?   03/06/22 0401  ?Assess: MEWS Score  ?Temp (!) 100.7 ?F (38.2 ?C)  ?BP 130/81  ?Pulse Rate (!) 120  ?Resp 20  ?SpO2 95 %  ?O2 Device Room Air  ?Assess: MEWS Score  ?MEWS Temp 1  ?MEWS Systolic 0  ?MEWS Pulse 2  ?MEWS RR 0  ?MEWS LOC 0  ?MEWS Score 3  ?MEWS Score Color Yellow  ?Assess: if the MEWS score is Yellow or Red  ?Were vital signs taken at a resting state? Yes  ?Does the patient meet 2 or more of the SIRS criteria? Yes  ?Does the patient have a confirmed or suspected source of infection? Yes  ?Provider and Rapid Response Notified? No  ?MEWS guidelines implemented *See Row Information* No, previously yellow, continue vital signs every 4 hours  ?Assess: SIRS CRITERIA  ?SIRS Temperature  0  ?SIRS Pulse 1  ?SIRS Respirations  0  ?SIRS WBC 0  ?SIRS Score Sum  1  ? ? ?

## 2022-03-06 NOTE — Progress Notes (Signed)
Dr. Christian Mate notified for yellow MEWS as per MEWS policy; order written for NPO except for meds and Tylenol administered. Barbaraann Faster, RN 4:42 AM 03/06/2022  ?

## 2022-03-06 NOTE — Progress Notes (Signed)
CC: Diverticulitis ?Subjective: ?He feels better today. ?Did have a fever and was given saline bolus. WBC up to 28k ?Has been tachycardic. ?Definitely looking at his numbers he looks sicker on paper than on clinical exam. ?He was very honest with me and said that today he was able to lean forward and stand up.  He said the day before yesterday he was not able to do that.  He continues to have watery bowel movements. ?He is note that I have personally reviewed the KUB as well as the CT scan of the abdomen and pelvis.  There is a contained area of perforation in the sigmoid colon.  KUB this morning showed no evidence of free air there is some air-fluid levels consistent with ileus ? ?Objective: ?Vital signs in last 24 hours: ?Temp:  [98.7 ?F (37.1 ?C)-102.9 ?F (39.4 ?C)] 99.3 ?F (37.4 ?C) (03/21 0726) ?Pulse Rate:  [107-130] 107 (03/21 0726) ?Resp:  [16-20] 18 (03/21 0726) ?BP: (118-132)/(74-86) 132/86 (03/21 0726) ?SpO2:  [95 %-100 %] 95 % (03/21 0726) ?Last BM Date : 03/05/22 ? ?Intake/Output from previous day: ?03/20 0701 - 03/21 0700 ?In: 3867.2 [I.V.:3086.8; IV Piggyback:780.4] ?Out: -  ?Intake/Output this shift: ?Total I/O ?In: -  ?Out: 175 [Urine:175] ? ?Physical exam: ? ?NAD alert ?Abd: soft, mild TTP w/o rebound , no peritonitis ?Ext; well perfused w/o edema ? ?Lab Results: ?CBC  ?Recent Labs  ?  03/05/22 ?8250 03/06/22 ?5397  ?WBC 26.6* 28.1*  ?HGB 13.4 12.2*  ?HCT 42.1 37.1*  ?PLT 336 278  ? ?BMET ?Recent Labs  ?  03/05/22 ?6734 03/06/22 ?1937  ?NA 135 137  ?K 4.1 3.3*  ?CL 101 107  ?CO2 26 23  ?GLUCOSE 134* 117*  ?BUN 12 17  ?CREATININE 1.26* 1.39*  ?CALCIUM 8.5* 8.2*  ? ?PT/INR ?No results for input(s): LABPROT, INR in the last 72 hours. ?ABG ?No results for input(s): PHART, HCO3 in the last 72 hours. ? ?Invalid input(s): PCO2, PO2 ? ?Studies/Results: ?CT Abdomen Pelvis W Contrast ? ?Result Date: 03/05/2022 ?CLINICAL DATA:  Abdominal pain, acute, nonlocalized. EXAM: CT ABDOMEN AND PELVIS WITH CONTRAST  TECHNIQUE: Multidetector CT imaging of the abdomen and pelvis was performed using the standard protocol following bolus administration of intravenous contrast. RADIATION DOSE REDUCTION: This exam was performed according to the departmental dose-optimization program which includes automated exposure control, adjustment of the mA and/or kV according to patient size and/or use of iterative reconstruction technique. CONTRAST:  145m OMNIPAQUE IOHEXOL 300 MG/ML  SOLN COMPARISON:  None. FINDINGS: Lower chest: No acute abnormality. Hepatobiliary: No focal liver abnormality is seen. No gallstones, gallbladder wall thickening, or biliary dilatation. Pancreas: Unremarkable Spleen: Unremarkable Adrenals/Urinary Tract: The adrenal glands are unremarkable. The kidneys are normal in size and position. Multiple cortical hypodensities are seen within the right kidney which are too small to accurately characterize but likely represent multiple tiny cortical cysts. The kidneys are otherwise unremarkable. The bladder is unremarkable. Stomach/Bowel: There is extensive pericolonic inflammatory stranding and marked focal bowel wall thickening involving the proximal sigmoid colon within the left lower quadrant of the abdomen best seen on axial image # 63/2 in keeping with changes of acute diverticulitis. There is a are multiple foci of extraluminal gas seen within the left lower quadrant in keeping with perforated diverticulitis. No loculated pericolonic fluid collection. Trace free fluid within the pelvis. No gross free intraperitoneal gas. No evidence of obstruction. Mild infiltration of the a colonic mesentery and enhancement of the peritoneal lining within the left lower  quadrant may reflect changes of developing peritonitis. Stomach, small bowel, and large bowel are otherwise unremarkable. Appendix normal. Vascular/Lymphatic: The abdominal vasculature is unremarkable. Shotty adenopathy within the adjacent sigmoid mesentery is likely  reactive in nature. No pathologic adenopathy within the abdomen and pelvis. Reproductive: Prostate is unremarkable. Other: No abdominal wall hernia. Musculoskeletal: No acute bone abnormality. No lytic or blastic bone lesion. Lobulated subcutaneous fluid superficial to the sacrum may reflect the sequela of prior trauma or surgical intervention or may reflect a a developing sacral decubitus wound. IMPRESSION: Perforated sigmoid diverticulitis. Peritoneal enhancement and mesenteric infiltration may reflect changes of developing peritonitis. Small free fluid within the pelvis. No loculated pericolonic fluid collections, gross free intraperitoneal gas, or evidence of obstruction. Lobulated subcutaneous fluid superficial to the sacrum of unclear significance. Correlation for history of trauma or surgical intervention may be helpful. Alternatively, this may reflect a developing sacral decubitus wound. Electronically Signed   By: Fidela Salisbury M.D.   On: 03/05/2022 00:24  ? ?DG ABD ACUTE 2+V W 1V CHEST ? ?Result Date: 03/06/2022 ?CLINICAL DATA:  Abdominal pain EXAM: DG ABDOMEN ACUTE WITH 1 VIEW CHEST COMPARISON:  None. FINDINGS: Multiple dilated loops of small bowel with air-fluid level seen on upright image. Small amount gas seen in the large bowel. No radiopaque calculi or other significant radiographic abnormality is seen. Heart size and mediastinal contours are within normal limits. Low lung volumes with hypoventilatory changes. IMPRESSION: Multiple dilated loops of small bowel with air-fluid level seen on upright image, findings are concerning for small bowel obstruction. These results will be called to the ordering clinician or representative by the Radiologist Assistant, and communication documented in the PACS or [employer]. Electronically Signed   By: Yetta Glassman M.D.   On: 03/06/2022 08:20   ? ?Anti-infectives: ?Anti-infectives (From admission, onward)  ? ? Start     Dose/Rate Route Frequency Ordered  Stop  ? 03/05/22 0700  piperacillin-tazobactam (ZOSYN) IVPB 3.375 g       ? 3.375 g ?12.5 mL/hr over 240 Minutes Intravenous Every 8 hours 03/05/22 0051 03/12/22 0759  ? 03/05/22 0045  piperacillin-tazobactam (ZOSYN) IVPB 3.375 g       ? 3.375 g ?100 mL/hr over 30 Minutes Intravenous  Once 03/05/22 0041 03/05/22 0224  ? ?  ? ? ?Assessment/Plan: ? ?Diverticulitis with contained perforation.  He is not clinically toxic nor peritonitic.  His numbers, laboratory values may indicate otherwise.  There is significant discrepancy between lab findings and clinical findings.  This is likely the result of a delayed Molap findings as compared to clinical live assessment. ?AB this morning does not show any free air but just some ileus.  He is very thirsty and is begging me to give him some clears.  I will be okay with only sips of clears.  He is getting aggressive fluid resuscitation with both crystalloids and albumin.  Discussed with nursing staff and also with the patient about tighter and more stricter ins and outs measurements. ?We will likely repeat imaging with CT in 24 to 48 hours depending on clinical condition. Order placed for Thursday  ?At this point does not require emergent surgical intervention.  He understands that he may deteriorate and may require Hartman's procedure.  I do not think that he is quite there yet. ?Continue A/bs  ?Please note that I spent greater than 55 minutes in this encounter including personally reviewing imaging studies, placing orders, coordinating his care and counseling the patient as well as performing appropriate  documentation ? ? ?Caroleen Hamman, MD, FACS ? ?03/06/2022 ? ? ? ?  ?

## 2022-03-06 NOTE — Progress Notes (Signed)
See new orders; blood cultures X 2 obtained and bolus infusing. Barbaraann Faster, RN 5:43 AM 03/06/2022  ?

## 2022-03-07 DIAGNOSIS — K5720 Diverticulitis of large intestine with perforation and abscess without bleeding: Secondary | ICD-10-CM | POA: Diagnosis not present

## 2022-03-07 LAB — CBC
HCT: 37.5 % — ABNORMAL LOW (ref 39.0–52.0)
Hemoglobin: 12.3 g/dL — ABNORMAL LOW (ref 13.0–17.0)
MCH: 30.1 pg (ref 26.0–34.0)
MCHC: 32.8 g/dL (ref 30.0–36.0)
MCV: 91.9 fL (ref 80.0–100.0)
Platelets: 285 10*3/uL (ref 150–400)
RBC: 4.08 MIL/uL — ABNORMAL LOW (ref 4.22–5.81)
RDW: 11.9 % (ref 11.5–15.5)
WBC: 13.4 10*3/uL — ABNORMAL HIGH (ref 4.0–10.5)
nRBC: 0 % (ref 0.0–0.2)

## 2022-03-07 LAB — COMPREHENSIVE METABOLIC PANEL
ALT: 18 U/L (ref 0–44)
AST: 17 U/L (ref 15–41)
Albumin: 3 g/dL — ABNORMAL LOW (ref 3.5–5.0)
Alkaline Phosphatase: 34 U/L — ABNORMAL LOW (ref 38–126)
Anion gap: 8 (ref 5–15)
BUN: 16 mg/dL (ref 6–20)
CO2: 20 mmol/L — ABNORMAL LOW (ref 22–32)
Calcium: 7.7 mg/dL — ABNORMAL LOW (ref 8.9–10.3)
Chloride: 111 mmol/L (ref 98–111)
Creatinine, Ser: 0.97 mg/dL (ref 0.61–1.24)
GFR, Estimated: >60 mL/min (ref >60)
Glucose, Bld: 116 mg/dL — ABNORMAL HIGH (ref 70–99)
Potassium: 3.4 mmol/L — ABNORMAL LOW (ref 3.5–5.1)
Sodium: 139 mmol/L (ref 135–145)
Total Bilirubin: 2.4 mg/dL — ABNORMAL HIGH (ref 0.3–1.2)
Total Protein: 6.8 g/dL (ref 6.5–8.1)

## 2022-03-07 LAB — GLUCOSE, CAPILLARY: Glucose-Capillary: 147 mg/dL — ABNORMAL HIGH (ref 70–99)

## 2022-03-07 MED ORDER — CYCLOBENZAPRINE HCL 10 MG PO TABS
5.0000 mg | ORAL_TABLET | Freq: Three times a day (TID) | ORAL | Status: DC | PRN
  Administered 2022-03-07 – 2022-03-11 (×4): 5 mg via ORAL
  Filled 2022-03-07 (×4): qty 1

## 2022-03-07 NOTE — Plan of Care (Signed)

## 2022-03-07 NOTE — Progress Notes (Signed)
CCMD called to say that patient's HR was in the 150's; found patient in bed, having just came from the [facility]; encouraged to relax and reduce his HR; telemetry connections checked; HR now 128. Will continue to monitor. Barbaraann Faster, RN 10:32 PM 03/07/2022 ? ?

## 2022-03-07 NOTE — Progress Notes (Signed)
[facility] ?SURGICAL PROGRESS NOTE (cpt [phone]) ? ?Hospital Day(s): 2.  ? ?Interval History: Patient seen and examined. He did have another fever overnight to 101.47F at 0500. He is still having what he described as crampy right abdominal pain. No nausea, emesis. His leukocytosis did improve; now 13.4K. Renal function has normalized; sCr - 0.97; UO - 285 ccs + unmeasured. Mild hypokalemia to 3.4. He continues on Zosyn. He is on sips of CLD.  ? ?Review of Systems:  ?Constitutional: + fever, denied chills  ?HEENT: denies cough or congestion  ?Respiratory: denies any shortness of breath  ?Cardiovascular: denies chest pain or palpitations  ?Gastrointestinal: + abdominal pain, denied nausea, emesis  ?Genitourinary: denies burning with urination or urinary frequency ?Musculoskeletal: denies pain, decreased motor or sensation ? ?Vital signs in last 24 hours: [min-max] current  ?Temp:  [98.3 ?F (36.8 ?C)-101.3 ?F (38.5 ?C)] 101.3 ?F (38.5 ?C) (03/22 0503) ?Pulse Rate:  [95-119] 119 (03/22 0503) ?Resp:  [18-20] 18 (03/22 0503) ?BP: (135-151)/(91-97) 151/97 (03/22 0503) ?SpO2:  [96 %-98 %] 98 % (03/22 0503)     Height: 6' (182.9 cm) Weight: 104.3 kg BMI (Calculated): 31.19  ? ?Intake/Output last 2 shifts:  ?03/21 0701 - 03/22 0700 ?In: 1230.8 [P.O.:170; I.V.:1010.8; IV Piggyback:50] ?Out: 285 [Urine:285]  ? ?Physical Exam:  ?Constitutional: alert, cooperative and no distress  ?HENT: normocephalic without obvious abnormality  ?Eyes: PERRL, EOM's grossly intact and symmetric  ?Respiratory: breathing non-labored at rest  ?Cardiovascular: regular rate and sinus rhythm  ?Gastrointestinal: Soft, tender to LLQ, non-distended, no rebound/guarding. He is without gross peritonitis  ?Musculoskeletal: no edema or wounds, motor and sensation grossly intact, NT  ? ? ?Labs:  ?CBC Latest Ref Rng & Units 03/07/2022 03/06/2022 03/05/2022  ?WBC 4.0 - 10.5 K/uL 13.4(H) 28.1(H) 26.6(H)  ?Hemoglobin 13.0 - 17.0 g/dL 12.3(L) 12.2(L)  13.4  ?Hematocrit 39.0 - 52.0 % 37.5(L) 37.1(L) 42.1  ?Platelets 150 - 400 K/uL 285 278 336  ? ?CMP Latest Ref Rng & Units 03/07/2022 03/06/2022 03/05/2022  ?Glucose 70 - 99 mg/dL 116(H) 117(H) 134(H)  ?BUN 6 - 20 mg/dL '16 17 12  '$ ?Creatinine 0.61 - 1.24 mg/dL 0.97 1.39(H) 1.26(H)  ?Sodium 135 - 145 mmol/L 139 137 135  ?Potassium 3.5 - 5.1 mmol/L 3.4(L) 3.3(L) 4.1  ?Chloride 98 - 111 mmol/L 111 107 101  ?CO2 22 - 32 mmol/L 20(L) 23 26  ?Calcium 8.9 - 10.3 mg/dL 7.7(L) 8.2(L) 8.5(L)  ?Total Protein 6.5 - 8.1 g/dL 6.8 - -  ?Total Bilirubin 0.3 - 1.2 mg/dL 2.4(H) - -  ?Alkaline Phos 38 - 126 U/L 34(L) - -  ?AST 15 - 41 U/L 17 - -  ?ALT 0 - 44 U/L 18 - -  ? ? ?Imaging studies: No new pertinent imaging studies ? ? ?Assessment/Plan: (ICD-10's: K57.92) ?41 y.o. male with fever overnight but improvement in leukocytosis and renal function admitted with perforated sigmoid diverticulitis.  ? ? - Will continue on CLD for now ? - Continue IV Abx (Zosyn) ? - Monitor abdominal examination ? - No emergent surgical intervention; He understands that should he fail to improve or clinically deteriorate, he will need more urgent intervention and likely colostomy ? - Will plan on CT Abdomen/Pelvis tomorrow (03/23) ? - Pain control prn (added Flexeril); antiemetics prn  ? - Mobilization as tolerated   ? ?All of the above findings and recommendations were discussed with the patient, and the medical team, and all of patient's questions were answered to his expressed satisfaction. ? ?-- ?  Edison Simon, PA-C ?[facility] ?03/07/2022, 7:39 AM ?[phone] ?M-F: 7am - 4pm ? ?

## 2022-03-08 ENCOUNTER — Other Ambulatory Visit: Payer: Self-pay

## 2022-03-08 ENCOUNTER — Inpatient Hospital Stay: Payer: Commercial Managed Care - PPO | Admitting: Certified Registered"

## 2022-03-08 ENCOUNTER — Inpatient Hospital Stay: Payer: Commercial Managed Care - PPO

## 2022-03-08 ENCOUNTER — Encounter: Payer: Self-pay | Admitting: Surgery

## 2022-03-08 ENCOUNTER — Encounter
Admission: EM | Disposition: A | Discharge status: Home Health | Payer: Self-pay | Source: Home / Self Care | Attending: Surgery

## 2022-03-08 DIAGNOSIS — K5720 Diverticulitis of large intestine with perforation and abscess without bleeding: Secondary | ICD-10-CM | POA: Diagnosis not present

## 2022-03-08 HISTORY — PX: LAPAROTOMY: SHX154

## 2022-03-08 HISTORY — PX: COLECTOMY WITH COLOSTOMY CREATION/HARTMANN PROCEDURE: SHX6598

## 2022-03-08 HISTORY — PX: APPENDECTOMY: SHX54

## 2022-03-08 LAB — BLOOD GAS, ARTERIAL
Acid-base deficit: 0.4 mmol/L (ref 0.0–2.0)
Bicarbonate: 24.8 mmol/L (ref 20.0–28.0)
O2 Saturation: 99.5 %
Patient temperature: 37
pCO2 arterial: 42 mmHg (ref 32–48)
pH, Arterial: 7.38 (ref 7.35–7.45)
pO2, Arterial: 120 mmHg — ABNORMAL HIGH (ref 83–108)

## 2022-03-08 LAB — TYPE AND SCREEN
ABO/RH(D): B POS
Antibody Screen: NEGATIVE

## 2022-03-08 LAB — ABO/RH: ABO/RH(D): B POS

## 2022-03-08 LAB — BASIC METABOLIC PANEL
Anion gap: 8 (ref 5–15)
BUN: 20 mg/dL (ref 6–20)
CO2: 26 mmol/L (ref 22–32)
Calcium: 7.9 mg/dL — ABNORMAL LOW (ref 8.9–10.3)
Chloride: 106 mmol/L (ref 98–111)
Creatinine, Ser: 1.15 mg/dL (ref 0.61–1.24)
GFR, Estimated: >60 mL/min (ref >60)
Glucose, Bld: 111 mg/dL — ABNORMAL HIGH (ref 70–99)
Potassium: 3.9 mmol/L (ref 3.5–5.1)
Sodium: 140 mmol/L (ref 135–145)

## 2022-03-08 LAB — CBC
HCT: 36.7 % — ABNORMAL LOW (ref 39.0–52.0)
Hemoglobin: 11.7 g/dL — ABNORMAL LOW (ref 13.0–17.0)
MCH: 30.2 pg (ref 26.0–34.0)
MCHC: 31.9 g/dL (ref 30.0–36.0)
MCV: 94.6 fL (ref 80.0–100.0)
Platelets: 300 10*3/uL (ref 150–400)
RBC: 3.88 MIL/uL — ABNORMAL LOW (ref 4.22–5.81)
RDW: 12.2 % (ref 11.5–15.5)
WBC: 19.6 10*3/uL — ABNORMAL HIGH (ref 4.0–10.5)
nRBC: 0 % (ref 0.0–0.2)

## 2022-03-08 SURGERY — LAPAROTOMY, EXPLORATORY
Anesthesia: General

## 2022-03-08 MED ORDER — ROCURONIUM BROMIDE 100 MG/10ML IV SOLN
INTRAVENOUS | Status: DC | PRN
  Administered 2022-03-08 (×3): 50 mg via INTRAVENOUS

## 2022-03-08 MED ORDER — SODIUM CHLORIDE 0.9 % IV SOLN
INTRAVENOUS | Status: DC | PRN
  Administered 2022-03-08: 70 mL

## 2022-03-08 MED ORDER — MIDAZOLAM HCL 2 MG/2ML IJ SOLN
INTRAMUSCULAR | Status: AC
  Filled 2022-03-08: qty 2

## 2022-03-08 MED ORDER — BUPIVACAINE-EPINEPHRINE (PF) 0.25% -1:200000 IJ SOLN
INTRAMUSCULAR | Status: DC | PRN
  Administered 2022-03-08: 30 mL

## 2022-03-08 MED ORDER — FENTANYL CITRATE PF 50 MCG/ML IJ SOSY
PREFILLED_SYRINGE | INTRAMUSCULAR | Status: AC
  Administered 2022-03-08: 50 ug via INTRAVENOUS
  Filled 2022-03-08: qty 1

## 2022-03-08 MED ORDER — MIDAZOLAM HCL 2 MG/2ML IJ SOLN
INTRAMUSCULAR | Status: DC | PRN
  Administered 2022-03-08: 2 mg via INTRAVENOUS

## 2022-03-08 MED ORDER — BUPIVACAINE LIPOSOME 1.3 % IJ SUSP
INTRAMUSCULAR | Status: AC
  Filled 2022-03-08: qty 20

## 2022-03-08 MED ORDER — PROPOFOL 10 MG/ML IV BOLUS
INTRAVENOUS | Status: AC
  Filled 2022-03-08: qty 20

## 2022-03-08 MED ORDER — DEXAMETHASONE SODIUM PHOSPHATE 10 MG/ML IJ SOLN
INTRAMUSCULAR | Status: AC
  Filled 2022-03-08: qty 1

## 2022-03-08 MED ORDER — LIDOCAINE HCL (PF) 2 % IJ SOLN
INTRAMUSCULAR | Status: AC
  Filled 2022-03-08: qty 5

## 2022-03-08 MED ORDER — ONDANSETRON HCL 4 MG/2ML IJ SOLN: 4.0000 mg | Freq: Once | INTRAMUSCULAR | Status: DC | PRN

## 2022-03-08 MED ORDER — LACTATED RINGERS IV SOLN: INTRAVENOUS | Status: DC | PRN

## 2022-03-08 MED ORDER — PIPERACILLIN-TAZOBACTAM 3.375 G IVPB
INTRAVENOUS | Status: AC
  Filled 2022-03-08: qty 50

## 2022-03-08 MED ORDER — CHLORHEXIDINE GLUCONATE CLOTH 2 % EX PADS: 6.0000 | MEDICATED_PAD | Freq: Every day | CUTANEOUS | Status: DC

## 2022-03-08 MED ORDER — FENTANYL CITRATE (PF) 100 MCG/2ML IJ SOLN
INTRAMUSCULAR | Status: AC
  Filled 2022-03-08: qty 2

## 2022-03-08 MED ORDER — HYDROMORPHONE HCL 1 MG/ML IJ SOLN
0.2500 mg | INTRAMUSCULAR | Status: DC | PRN
  Administered 2022-03-08: 0.5 mg via INTRAVENOUS

## 2022-03-08 MED ORDER — ACETAMINOPHEN 10 MG/ML IV SOLN
INTRAVENOUS | Status: AC
  Filled 2022-03-08: qty 100

## 2022-03-08 MED ORDER — KETAMINE HCL 50 MG/5ML IJ SOSY
PREFILLED_SYRINGE | INTRAMUSCULAR | Status: AC
  Filled 2022-03-08: qty 5

## 2022-03-08 MED ORDER — PHENYLEPHRINE HCL-NACL 20-0.9 MG/250ML-% IV SOLN
INTRAVENOUS | Status: DC | PRN
  Administered 2022-03-08: 50 ug/min via INTRAVENOUS

## 2022-03-08 MED ORDER — POTASSIUM CHLORIDE IN NACL 20-0.9 MEQ/L-% IV SOLN
INTRAVENOUS | Status: DC
Start: 2022-03-08 — End: 2022-03-08

## 2022-03-08 MED ORDER — OXYCODONE HCL 5 MG/5ML PO SOLN: 5.0000 mg | Freq: Once | ORAL | Status: DC | PRN

## 2022-03-08 MED ORDER — ROCURONIUM BROMIDE 10 MG/ML (PF) SYRINGE
PREFILLED_SYRINGE | INTRAVENOUS | Status: AC
  Filled 2022-03-08: qty 10

## 2022-03-08 MED ORDER — LACTATED RINGERS IV SOLN: INTRAVENOUS | Status: DC

## 2022-03-08 MED ORDER — SEVOFLURANE IN SOLN
RESPIRATORY_TRACT | Status: AC
  Filled 2022-03-08: qty 250

## 2022-03-08 MED ORDER — ONDANSETRON HCL 4 MG/2ML IJ SOLN
INTRAMUSCULAR | Status: AC
  Filled 2022-03-08: qty 2

## 2022-03-08 MED ORDER — SODIUM CHLORIDE (PF) 0.9 % IJ SOLN
INTRAMUSCULAR | Status: AC
  Filled 2022-03-08: qty 50

## 2022-03-08 MED ORDER — LACTATED RINGERS IV SOLN
INTRAVENOUS | Status: DC | PRN
Start: 2022-03-08 — End: 2022-03-08

## 2022-03-08 MED ORDER — LIDOCAINE HCL (CARDIAC) PF 100 MG/5ML IV SOSY
PREFILLED_SYRINGE | INTRAVENOUS | Status: DC | PRN
  Administered 2022-03-08: 100 mg via INTRAVENOUS

## 2022-03-08 MED ORDER — OXYCODONE HCL 5 MG PO TABS: 5.0000 mg | ORAL_TABLET | Freq: Once | ORAL | Status: DC | PRN

## 2022-03-08 MED ORDER — BUPIVACAINE-EPINEPHRINE (PF) 0.25% -1:200000 IJ SOLN
INTRAMUSCULAR | Status: AC
  Filled 2022-03-08: qty 30

## 2022-03-08 MED ORDER — SUCCINYLCHOLINE CHLORIDE 200 MG/10ML IV SOSY
PREFILLED_SYRINGE | INTRAVENOUS | Status: DC | PRN
  Administered 2022-03-08: 120 mg via INTRAVENOUS

## 2022-03-08 MED ORDER — POTASSIUM CHLORIDE IN NACL 20-0.9 MEQ/L-% IV SOLN
INTRAVENOUS | Status: DC
Start: 2022-03-08 — End: 2022-03-08
  Filled 2022-03-08 (×2): qty 1000

## 2022-03-08 MED ORDER — FENTANYL CITRATE PF 50 MCG/ML IJ SOSY
PREFILLED_SYRINGE | INTRAMUSCULAR | Status: AC
  Filled 2022-03-08: qty 1

## 2022-03-08 MED ORDER — SUGAMMADEX SODIUM 500 MG/5ML IV SOLN
INTRAVENOUS | Status: DC | PRN
  Administered 2022-03-08: 400 mg via INTRAVENOUS

## 2022-03-08 MED ORDER — FENTANYL CITRATE PF 50 MCG/ML IJ SOSY: 50.0000 ug | PREFILLED_SYRINGE | Freq: Once | INTRAMUSCULAR | Status: AC

## 2022-03-08 MED ORDER — FENTANYL CITRATE (PF) 100 MCG/2ML IJ SOLN
INTRAMUSCULAR | Status: DC | PRN
  Administered 2022-03-08 (×2): 50 ug via INTRAVENOUS

## 2022-03-08 MED ORDER — HYDROMORPHONE HCL 1 MG/ML IJ SOLN
INTRAMUSCULAR | Status: AC
  Administered 2022-03-08: 0.5 mg via INTRAVENOUS
  Filled 2022-03-08: qty 1

## 2022-03-08 MED ORDER — IOHEXOL 300 MG/ML  SOLN
100.0000 mL | Freq: Once | INTRAMUSCULAR | Status: AC | PRN
  Administered 2022-03-08: 100 mL via INTRAVENOUS

## 2022-03-08 MED ORDER — 0.9 % SODIUM CHLORIDE (POUR BTL) OPTIME
TOPICAL | Status: DC | PRN
  Administered 2022-03-08: 7000 mL

## 2022-03-08 MED ORDER — ACETAMINOPHEN 10 MG/ML IV SOLN
1000.0000 mg | Freq: Four times a day (QID) | INTRAVENOUS | Status: AC
  Administered 2022-03-09 (×4): 1000 mg via INTRAVENOUS
  Filled 2022-03-08 (×4): qty 100

## 2022-03-08 MED ORDER — FENTANYL CITRATE PF 50 MCG/ML IJ SOSY
50.0000 ug | PREFILLED_SYRINGE | Freq: Once | INTRAMUSCULAR | Status: AC
  Administered 2022-03-08: 50 ug via INTRAVENOUS

## 2022-03-08 MED ORDER — DEXAMETHASONE SODIUM PHOSPHATE 10 MG/ML IJ SOLN
INTRAMUSCULAR | Status: DC | PRN
Start: 2022-03-08 — End: 2022-03-08
  Administered 2022-03-08: 10 mg via INTRAVENOUS

## 2022-03-08 MED ORDER — IOHEXOL 9 MG/ML PO SOLN
500.0000 mL | ORAL | Status: AC
  Administered 2022-03-08 (×2): 500 mL via ORAL

## 2022-03-08 MED ORDER — ALBUMIN HUMAN 5 % IV SOLN
INTRAVENOUS | Status: AC
  Filled 2022-03-08: qty 500

## 2022-03-08 MED ORDER — ACETAMINOPHEN 10 MG/ML IV SOLN: 1000.0000 mg | Freq: Once | INTRAVENOUS | Status: DC | PRN

## 2022-03-08 MED ORDER — ALBUTEROL SULFATE HFA 108 (90 BASE) MCG/ACT IN AERS
INHALATION_SPRAY | RESPIRATORY_TRACT | Status: DC | PRN
  Administered 2022-03-08: 4 via RESPIRATORY_TRACT
  Administered 2022-03-08: 8 via RESPIRATORY_TRACT

## 2022-03-08 MED ORDER — ENOXAPARIN SODIUM 60 MG/0.6ML IJ SOSY: 0.5000 mg/kg | PREFILLED_SYRINGE | INTRAMUSCULAR | Status: DC

## 2022-03-08 MED ORDER — HYDROMORPHONE HCL 1 MG/ML IJ SOLN
INTRAMUSCULAR | Status: AC
Start: 2022-03-08 — End: ?
  Filled 2022-03-08: qty 1

## 2022-03-08 MED ORDER — ACETAMINOPHEN 10 MG/ML IV SOLN
INTRAVENOUS | Status: DC | PRN
  Administered 2022-03-08: 1000 mg via INTRAVENOUS

## 2022-03-08 MED ORDER — DEXMEDETOMIDINE HCL IN NACL 200 MCG/50ML IV SOLN
INTRAVENOUS | Status: DC | PRN
  Administered 2022-03-08: 20 ug via INTRAVENOUS

## 2022-03-08 MED ORDER — ENOXAPARIN SODIUM 60 MG/0.6ML IJ SOSY
0.5000 mg/kg | PREFILLED_SYRINGE | INTRAMUSCULAR | Status: DC
  Administered 2022-03-12: 52.5 mg via SUBCUTANEOUS
  Filled 2022-03-08 (×7): qty 0.6

## 2022-03-08 MED ORDER — ALBUMIN HUMAN 5 % IV SOLN: INTRAVENOUS | Status: DC | PRN

## 2022-03-08 MED ORDER — FENTANYL CITRATE (PF) 100 MCG/2ML IJ SOLN: 25.0000 ug | INTRAMUSCULAR | Status: DC | PRN

## 2022-03-08 MED ORDER — KETAMINE HCL 10 MG/ML IJ SOLN
INTRAMUSCULAR | Status: DC | PRN
  Administered 2022-03-08: 50 mg via INTRAVENOUS

## 2022-03-08 MED ORDER — PROPOFOL 10 MG/ML IV BOLUS
INTRAVENOUS | Status: DC | PRN
  Administered 2022-03-08: 200 mg via INTRAVENOUS

## 2022-03-08 MED ORDER — DEXTROSE IN LACTATED RINGERS 5 % IV SOLN: INTRAVENOUS | Status: AC

## 2022-03-08 MED ORDER — ONDANSETRON HCL 4 MG/2ML IJ SOLN
INTRAMUSCULAR | Status: DC | PRN
  Administered 2022-03-08: 4 mg via INTRAVENOUS

## 2022-03-08 MED ORDER — ALBUMIN HUMAN 25 % IV SOLN
25.0000 g | Freq: Once | INTRAVENOUS | Status: AC
  Administered 2022-03-09: 25 g via INTRAVENOUS
  Filled 2022-03-08: qty 100

## 2022-03-08 SURGICAL SUPPLY — 71 items
APPLIER CLIP 11 MED OPEN (CLIP)
APPLIER CLIP 13 LRG OPEN (CLIP)
BARRIER ADH SEPRAFILM 3INX5IN (MISCELLANEOUS) ×2 IMPLANT
BLADE CLIPPER SURG (BLADE) ×3 IMPLANT
BLADE SURG SZ10 CARB STEEL (BLADE) ×3 IMPLANT
BULB RESERV EVAC DRAIN JP 100C (MISCELLANEOUS) ×2 IMPLANT
CHLORAPREP W/TINT 26 (MISCELLANEOUS) ×3 IMPLANT
CLIP APPLIE 11 MED OPEN (CLIP) IMPLANT
CLIP APPLIE 13 LRG OPEN (CLIP) IMPLANT
CNTNR SPEC 2.5X3XGRAD LEK (MISCELLANEOUS) ×2
CONT SPEC 4OZ STER OR WHT (MISCELLANEOUS) ×1
CONTAINER SPEC 2.5X3XGRAD LEK (MISCELLANEOUS) IMPLANT
COVER BACK TABLE REUSABLE LG (DRAPES) ×3 IMPLANT
DRAPE LAPAROTOMY 100X77 ABD (DRAPES) ×3 IMPLANT
DRSG OPSITE POSTOP 4X10 (GAUZE/BANDAGES/DRESSINGS) IMPLANT
DRSG OPSITE POSTOP 4X12 (GAUZE/BANDAGES/DRESSINGS) IMPLANT
DRSG TEGADERM 4X4.75 (GAUZE/BANDAGES/DRESSINGS) ×2 IMPLANT
ELECT BLADE 6.5 EXT (BLADE) ×3 IMPLANT
ELECT EZSTD 165MM 6.5IN (MISCELLANEOUS) ×3
ELECT REM PT RETURN 9FT ADLT (ELECTROSURGICAL) ×3
ELECTRODE EZSTD 165MM 6.5IN (MISCELLANEOUS) ×2 IMPLANT
ELECTRODE REM PT RTRN 9FT ADLT (ELECTROSURGICAL) ×2 IMPLANT
GAUZE 4X4 16PLY ~~LOC~~+RFID DBL (SPONGE) ×2 IMPLANT
GAUZE SPONGE 4X4 12PLY STRL (GAUZE/BANDAGES/DRESSINGS) ×3 IMPLANT
GLOVE SURG ENC MOIS LTX SZ7 (GLOVE) ×6 IMPLANT
GOWN STRL REUS W/ TWL LRG LVL3 (GOWN DISPOSABLE) ×8 IMPLANT
GOWN STRL REUS W/TWL LRG LVL3 (GOWN DISPOSABLE) ×6
HANDLE SUCTION POOLE (INSTRUMENTS) ×2 IMPLANT
HANDLE YANKAUER SUCT BULB TIP (MISCELLANEOUS) ×3 IMPLANT
JACKSON PRATT CHANNEL DRAIN 19FR ×2 IMPLANT
KIT OSTOMY 2 PC DRNBL 2.25 STR (WOUND CARE) IMPLANT
KIT OSTOMY DRAINABLE 2.25 STR (WOUND CARE) ×1
KIT TURNOVER KIT A (KITS) ×3 IMPLANT
LABEL OR SOLS (LABEL) ×3 IMPLANT
LIGASURE IMPACT 36 18CM CVD LR (INSTRUMENTS) ×3 IMPLANT
MANIFOLD NEPTUNE II (INSTRUMENTS) ×3 IMPLANT
NEEDLE HYPO 22GX1.5 SAFETY (NEEDLE) ×6 IMPLANT
NS IRRIG 1000ML POUR BTL (IV SOLUTION) ×9 IMPLANT
PACK BASIN MAJOR ARMC (MISCELLANEOUS) ×3 IMPLANT
PACK COLON CLEAN CLOSURE (MISCELLANEOUS) ×2 IMPLANT
PACK DRAINAGE HVY KERLIX W/SPG (MISCELLANEOUS) ×1 IMPLANT
PAD ABD DERMACEA PRESS 5X9 (GAUZE/BANDAGES/DRESSINGS) ×2 IMPLANT
RELOAD BL CONTOUR (ENDOMECHANICALS) ×6 IMPLANT
RELOAD PROXIMATE 75MM BLUE (ENDOMECHANICALS) IMPLANT
RELOAD STAPLE 40 BLU REG (ENDOMECHANICALS) IMPLANT
RELOAD STAPLE 75 3.8 BLU REG (ENDOMECHANICALS) IMPLANT
SPONGE T-LAP 18X18 ~~LOC~~+RFID (SPONGE) ×15 IMPLANT
SPONGE T-LAP 18X36 ~~LOC~~+RFID STR (SPONGE) ×3 IMPLANT
STAPLER CVD CUT BL 40 RELOAD (ENDOMECHANICALS) ×6 IMPLANT
STAPLER CVD CUT BLU 40 RELOAD (ENDOMECHANICALS) ×2 IMPLANT
STAPLER PROXIMATE 75MM BLUE (STAPLE) IMPLANT
STAPLER SKIN PROX 35W (STAPLE) ×3 IMPLANT
SUCTION POOLE HANDLE (INSTRUMENTS) ×3
SUT ETHILON 3-0 FS-10 30 BLK (SUTURE) ×6
SUT PDS AB 0 CT1 27 (SUTURE) ×9 IMPLANT
SUT SILK 2 0 (SUTURE) ×1
SUT SILK 2 0 SH CR/8 (SUTURE) ×3 IMPLANT
SUT SILK 2 0SH CR/8 30 (SUTURE) ×3 IMPLANT
SUT SILK 2-0 18XBRD TIE 12 (SUTURE) ×2 IMPLANT
SUT SILK 3-0 (SUTURE) ×1 IMPLANT
SUT VIC AB 0 CT1 36 (SUTURE) ×6 IMPLANT
SUT VIC AB 2-0 SH 27 (SUTURE) ×2
SUT VIC AB 2-0 SH 27XBRD (SUTURE) ×4 IMPLANT
SUT VIC AB 3-0 SH 27 (SUTURE) ×6
SUT VIC AB 3-0 SH 27X BRD (SUTURE) ×2 IMPLANT
SUTURE EHLN 3-0 FS-10 30 BLK (SUTURE) IMPLANT
SYR 20ML LL LF (SYRINGE) ×4 IMPLANT
SYR 30ML LL (SYRINGE) ×6 IMPLANT
SYR 3ML LL SCALE MARK (SYRINGE) ×3 IMPLANT
TRAY FOLEY MTR SLVR 16FR STAT (SET/KITS/TRAYS/PACK) ×3 IMPLANT
WATER STERILE IRR 500ML POUR (IV SOLUTION) ×2 IMPLANT

## 2022-03-08 NOTE — Progress Notes (Signed)
Upon transfer back to room, patient awake/alert x4. JP x2 intact/patent. Left quad colostomy intact, midline incision with dressing c/d/I ?Indwelling foley patent. Patient does not remember pulling NGT out, reviewed procedure with patient and possible need to replace NGT tomorrow, patient verbalizes that he does not want NGT. Dr. Dahlia Byes made aware. Afebrile, ST 110's. Report given to accepting RN Family updated ?

## 2022-03-08 NOTE — Progress Notes (Signed)
Patient seen and examined.  I have discussed the case with Dr. Hampton Abbot. ?Dr. Hampton Abbot currently has a couple of cases and the OR schedule is very full.  I am on call and have availability in the afternoon.  Sears has worsening clinically and continues to have fevers as well as an increasing white count.  Repeat CT scan personally reviewed showing evidence of worsening ascites with abscess causing a partial small bowel obstruction.  With clear-cut signs of worsening of perforated diverticulitis.  There is new intraperitoneal air. ?On exam surprisingly his abdomen is tender but not peritonitic however his overall appearance is worsening. ? ?I had a lengthy discussion with the patient and also his family.  Since I am available I think the right thing to do is to go ahead and  expedite his care.  I do think that he needs an urgent laparotomy and Hartman's procedure.  Procedure discussed with the patient and the family in detail.  Risk, benefits and possible complications (bleeding, infection, injury to adjacent structures ) discussed with them in detail.  They understand and agree with the plan.  They wish to proceed. ?Sister is a Marine scientist with [employer].  She had some specific concerns that I was able to address. ?She is now comfortable with me performing surgery. ?

## 2022-03-08 NOTE — Op Note (Addendum)
PROCEDURES: ?1. Drainage of intra-abdominal abscess interloop ?2. Reduction of internal Hernia ?3. 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 Splenic Flexure takedown ?5. Appendectomy ?6. Partial omentectomy ? ?Pre-operative Diagnosis: Perforated diverticulitis ? ?Post-operative Diagnosis: same ? ?Surgeon: Marjory Lies Kail Fraley  ? ?Assistants: Dr. Hampton Abbot and Otho Ket PA-C BOth essential for exposure, complexity of the case and clinical Judgment ? ?Anesthesia: General endotracheal anesthesia ? ?ASA Class: 4 ? ? ?Surgeon: Caroleen Hamman , MD FACS ? ?Anesthesia: Gen. with endotracheal tube ? ?Findings: ?Fecal Peritonitis ?Interloop intra-abdominal abscess ?CLose loop obstruction caused by Diverticulitis and Phlegmon ?Perforated Descending colon diverticulitis ?Non viable omentum due to severe disease ? ?Estimated Blood Loss: 50cc ?        ?Drains: two #19 blake drain, Pelvis and Upper abdomen ?        ?Specimens: colon  and appendix  ?      ?Complications: none ?        ?Condition: stable ? ?Procedure Details  ?The patient was seen again in the Holding Room. The benefits, complications, treatment options, and expected outcomes were discussed with the patient. The risks of bleeding, infection, recurrence of symptoms, failure to resolve symptoms,  bowel injury, any of which could require further surgery were reviewed with the patient.   The patient was taken to Operating Room, identified as [place] III and the procedure verified.  A Time Out was held and the above information confirmed. ? ?Prior to the induction of general anesthesia, antibiotic prophylaxis was administered. VTE prophylaxis was in place. General endotracheal anesthesia was then administered and tolerated well. After the induction, the abdomen was prepped with Chloraprep and draped in the sterile fashion. The patient was positioned in the supine position. ?Generous midline laparotomy was performed from xiphoid to pubis  with a 10 blade knife.  Electrocautery was used to dissect through subcutaneous tissue.  Midline fascia identified elevated and incised.  We entered the abdominal cavity with obvious fecal peritonitis purulent ascites. ?He did have tight abdominal wall and he was very difficult to work with.  The expertise and help from both Dr. Gelene Mink, PA-C were essential to perform an optimal procedure as well as a safe procedure guaranteeing safety and good exposure. ?Visualized that was actually a segment of the small bowel with a closed-loop obstruction due to a phlegmon.  This was taken down with Metzenbaum scissors and we relieve the obstruction/volvulus. ?Tension then was turned to the pelvis where there was an abscess that was interloop abscess that was drained by finger fracturing.  We cultured the purulent ascitic fluid.  It was also purulence combined with liquid feces in the abdominal cavity.  Her able to identify the perforation within the descending colon and close it with a figure-of-eight silk suture. ?Tension then was turned to the pelvis where we incised the white line of Toldt and perform a lateral to medial mobilization of the sigmoid colon and descending colon.  Please note that we identified the left ureter clearly.  We carried our dissection down the pelvis and using the LigaSure device the mesorectum was also divided.  We selected a point of the upper rectum as a transection margin.  Using contour stapler the rectum was divided in the standard fashion.  We used the rectum the sigmoid as a handle and proceeded more cephalad using the LigaSure device.  Please note that there was significant phlegmonous changes within the descending colon.  The whole descending colon and sigmoid  colon were diseased and inflamed. ?We needed to take the splenic flexure using a combination of cautery and LigaSure device in order to have a good mobile colon that would reach out to the abdominal wall to create an end  colostomy. ?Needed to transect the mesentery of the left colon including the superior mesenteric vein with suture ligated the left pedicle as well as the superior mesenteric vein in the standard fashion.  Also the omentum was taken off from the transverse colon to gain appropriate mobility. ?Confirmed the position of the NG tube.   ?I felt comfortable resecting the distal transverse colon as a good site of healthy colon. ?We visualized the right upper quadrant of the liver and there was also purulent and feculent ascitic fluid.  We irrigated the abdominal wall with at least 5 L of warm saline ?Drains #19 French drains were placed within the right upper and left upper quadrant and another 1 within the pelvis in the standard fashion.  Able to milk the small bowel that was very dilated into the stomach.  There was at least 750 cc of bilious fluid that was evacuated via the NG tube. ?We visualized the appendix and there was some inflammatory changes likely related to the severe peritonitis.  I decided to proceed with appendectomy.  The mesoappendix was divided with LigaSure and the appendix was divided with a contour stapler in the standard fashion. ?We used liposomal Marcaine throughout the abdominal wall ?Selected a defect in the left upper quadrant to create a fascial defect and to exteriorize the end of the colon to create an end colostomy. ?We also looked at the greater omentum and there were area of hypoperfusion. We decided to remove the non viable areas and performed partial greater omentectomy using ligasure device.  ?We changed gloves  close the  abdomen with a 0 PDS suture in a running fashion using small bite technique and the skin was left open due to the severe infection.  ?Tension then was turned to the left upper quadrant where we were able to mature the end colostomy with multiple 3-0 Vicryl using a Brooke ostomy technique.  An appliance was applied. ? ? ?Needle and laparotomy count were correct and  there were no immediate complications. ? ?I was asked by coding department from [facility] to clarify the involvement of Dr. Hampton Abbot and Otho Ket.PAC ?I wanted to reemphasize that this case could have not be done without an experienced surgeon and physician assistant.  They assistance was imperative for appropriate exposure throughout the entire operation; operation is a very dynamic procedure; and I wanted to clarify that this lasted 3+ hours.  Mr. Freda Jackson exposed the appropriate surgical plane with large Richardson retractors throughout the entire operation.  Specifically Dr. Hampton Abbot helped with closure and creation of the ostomy ? ? ?Caroleen Hamman, MD, FACS ? ?  ?

## 2022-03-08 NOTE — Anesthesia Procedure Notes (Signed)
Procedure Name: Intubation ?Date/Time: 03/08/2022 2:36 PM ?Performed by: Loletha Grayer, CRNA ?Pre-anesthesia Checklist: Patient identified, Patient being monitored, Timeout performed, Emergency Drugs available and Suction available ?Patient Re-evaluated:Patient Re-evaluated prior to induction ?Oxygen Delivery Method: Circle system utilized ?Preoxygenation: Pre-oxygenation with 100% oxygen ?Induction Type: IV induction, Rapid sequence and Cricoid Pressure applied ?Ventilation: Mask ventilation without difficulty ?Laryngoscope Size: McGraph and 4 ?Grade View: Grade I ?Tube type: Oral ?Tube size: 7.5 mm ?Number of attempts: 1 ?Airway Equipment and Method: Stylet ?Placement Confirmation: ETT inserted through vocal cords under direct vision, positive ETCO2 and breath sounds checked- equal and bilateral ?Secured at: 22 cm ?Tube secured with: Tape ?Dental Injury: Teeth and Oropharynx as per pre-operative assessment  ? ? ? ? ?

## 2022-03-08 NOTE — Progress Notes (Addendum)
?   03/08/22 0013  ?Assess: MEWS Score  ?Temp 100.1 ?F (37.8 ?C)  ?BP (!) 153/91  ?Pulse Rate (!) 130  ?Resp (!) 28  ?SpO2 98 %  ?Assess: MEWS Score  ?MEWS Temp 0  ?MEWS Systolic 0  ?MEWS Pulse 3  ?MEWS RR 2  ?MEWS LOC 0  ?MEWS Score 5  ?MEWS Score Color Red  ?Assess: SIRS CRITERIA  ?SIRS Temperature  0  ?SIRS Pulse 1  ?SIRS Respirations  1  ?SIRS WBC 0  ?SIRS Score Sum  2  ? ? ?

## 2022-03-08 NOTE — Transfer of Care (Signed)
Immediate Anesthesia Transfer of Care Note ? ?Patient: Erik Johns ? ?Procedure(s) Performed: EXPLORATORY LAPAROTOMY ?COLECTOMY WITH COLOSTOMY CREATION/HARTMANN PROCEDURE ?APPENDECTOMY ? ?Patient Location: PACU ? ?Anesthesia Type:General ? ?Level of Consciousness: sedated ? ?Airway & Oxygen Therapy: Patient Spontanous Breathing and Patient connected to face mask oxygen ? ?Post-op Assessment: Report given to RN and Post -op Vital signs reviewed and stable ? ?Post vital signs: Reviewed and stable ? ?Last Vitals:  ?Vitals Value Taken Time  ?BP 139/90 03/08/22 1905  ?Temp    ?Pulse 130 03/08/22 1908  ?Resp 20 03/08/22 1908  ?SpO2 93 % 03/08/22 1908  ?Vitals shown include unvalidated device data. ? ?Last Pain:  ?Vitals:  ? 03/08/22 1355  ?TempSrc:   ?PainSc: 10-Worst pain ever  ?   ? ?Patients Stated Pain Goal: 2 (03/08/22 0029) ? ?Complications: No notable events documented. ?

## 2022-03-08 NOTE — Progress Notes (Signed)
Patient in PACU 2.  Pulled NGT out 6-8 cm.  Pt will not let this RN or deborah RN advance tube back.  Pulling/pushing staff away attempting to get to NGT.  Called and informed dr pabon that pt will not leave NGT in and pt would need soft wrist restraints if left in.  Per dr pabon, ok to remove NGT.   ?

## 2022-03-08 NOTE — Progress Notes (Signed)
PHARMACIST - PHYSICIAN COMMUNICATION ? ?CONCERNING:  Enoxaparin (Lovenox) for DVT Prophylaxis  ? ? ?RECOMMENDATION: ?Patient was prescribed enoxaprin '40mg'$  q24 hours for VTE prophylaxis.  ? ?Filed Weights  ? 03/04/22 2210  ?Weight: 104.3 kg (230 lb)  ? ? ?Body mass index is 31.19 kg/m?. ? ?Estimated Creatinine Clearance: 107.7 mL/min (by C-G formula based on SCr of 1.15 mg/dL). ? ? ?Based on Alexander patient is candidate for enoxaparin 0.'5mg'$ /kg TBW SQ every 24 hours based on BMI being >30. ? ? ?DESCRIPTION: ?Pharmacy has adjusted enoxaparin dose per [facility] policy. ? ?Patient is now receiving enoxaparin 0.5 mg/kg every 24 hours  ? ?Renda Rolls, PharmD, MBA ?03/08/2022 ?11:02 PM ? ? ?

## 2022-03-08 NOTE — Progress Notes (Signed)
RED MEWS discussed with Dr. Hampton Abbot; HR, temp and RR elevated; Meds administered; pain decreasing; O2 at 3L/[place]; patient states that his pain is much better; HR now 118; air turned down for comfort; personal belonging within reach, along with callbell; for CT in am; will continue to monitor. Barbaraann Faster, RN 1:25 AM 03/08/2022  ?

## 2022-03-08 NOTE — Progress Notes (Addendum)
[name] ?SURGICAL PROGRESS NOTE ? ?Hospital Day(s): 3.  ? ?Interval History: Patient seen and examined. T-max in last 24 hours is 100.62F at 0015 last night. His leukocytosis did worsen this morning to 19.6K. Renal function in normal range with sCr - 1.15 (although up from 0.97 yesterday); UO - unmeasured. No significant electrolyte derangements. He continues on Zosyn. He is NPO this morning. Plan for repeat CT this morning.  ? ?Review of Systems:  ?Constitutional: + fever, denied chills  ?HEENT: denies cough or congestion  ?Respiratory: denies any shortness of breath  ?Cardiovascular: denies chest pain or palpitations  ?Gastrointestinal: + abdominal pain, denied nausea, emesis  ?Genitourinary: denies burning with urination or urinary frequency ?Musculoskeletal: denies pain, decreased motor or sensation ? ?Vital signs in last 24 hours: [min-max] current  ?Temp:  [98.3 ?F (36.8 ?C)-100.1 ?F (37.8 ?C)] 99.3 ?F (37.4 ?C) (03/23 0355) ?Pulse Rate:  [109-130] 121 (03/23 0355) ?Resp:  [15-28] 24 (03/23 0355) ?BP: (126-153)/(91-94) 142/94 (03/23 0355) ?SpO2:  [94 %-99 %] 96 % (03/23 0355)     Height: 6' (182.9 cm) Weight: 104.3 kg BMI (Calculated): 31.19  ? ?Intake/Output last 2 shifts:  ?03/22 0701 - 03/23 0700 ?In: 3646.6 [P.O.:60; I.V.:3336.3; IV Piggyback:250.3] ?Out: -   ? ?Physical Exam:  ?Constitutional: alert, cooperative and no distress  ?HENT: normocephalic without obvious abnormality  ?Eyes: PERRL, EOM's grossly intact and symmetric  ?Respiratory: breathing non-labored at rest  ?Cardiovascular: regular rate and sinus rhythm  ?Gastrointestinal: Soft, tender to LLQ, non-distended, no rebound/guarding. He is without gross peritonitis  ?Musculoskeletal: no edema or wounds, motor and sensation grossly intact, NT  ? ? ?Labs:  ? ?  Latest Ref Rng & Units 03/08/2022  ?  5:44 AM 03/07/2022  ?  5:39 AM 03/06/2022  ?  5:45 AM  ?CBC  ?WBC 4.0 - 10.5 K/uL 19.6   13.4   28.1    ?Hemoglobin 13.0 - 17.0 g/dL  11.7   12.3   12.2    ?Hematocrit 39.0 - 52.0 % 36.7   37.5   37.1    ?Platelets 150 - 400 K/uL 300   285   278    ? ? ?  Latest Ref Rng & Units 03/08/2022  ?  5:44 AM 03/07/2022  ?  5:39 AM 03/06/2022  ?  5:45 AM  ?CMP  ?Glucose 70 - 99 mg/dL 111   116   117    ?BUN 6 - 20 mg/dL '20   16   17    '$ ?Creatinine 0.61 - 1.24 mg/dL 1.15   0.97   1.39    ?Sodium 135 - 145 mmol/L 140   139   137    ?Potassium 3.5 - 5.1 mmol/L 3.9   3.4   3.3    ?Chloride 98 - 111 mmol/L 106   111   107    ?CO2 22 - 32 mmol/L '26   20   23    '$ ?Calcium 8.9 - 10.3 mg/dL 7.9   7.7   8.2    ?Total Protein 6.5 - 8.1 g/dL  6.8     ?Total Bilirubin 0.3 - 1.2 mg/dL  2.4     ?Alkaline Phos 38 - 126 U/L  34     ?AST 15 - 41 U/L  17     ?ALT 0 - 44 U/L  18     ? ? ?Imaging studies:  ? ?CT Abdomen/Pelvis (03/08/2021) personally reviewed which shows continued inflammatory process in the  descending colon with significant increase in free fluid around the spleen, liver, left colon, and pelvis, and radiologist report pending:  ? ? ?Assessment/Plan: (ICD-10's: K57.92) ?41 y.o. male with fever overnight but improvement in leukocytosis and renal function admitted with perforated sigmoid diverticulitis.  ? ? - Unfortunately given his continued tachycardia, low grade fever, worsening leukocytosis, and increase in free fluid on CT, it is likely the most prudent decision to proceed with exploratory laparotomy and Hartman's this afternoon. I will discuss with IR regarding potential for drain placement; however, given this fluid is not well organized, drains may not be a reliable option.  ? - All risks, benefits, and alternatives to above procedure(s) were discussed with the patient, all of his questions were answered to his expressed satisfaction, patient expresses he agrees to proceed, and informed consent was obtained.  ? - NPO + IVF support ? - Continue IV Abx (Zosyn) ? - Monitor abdominal examination ? - Pain control prn; antiemetics prn  ? - Mobilization as  tolerated   ? ?All of the above findings and recommendations were discussed with the patient, and the medical team, and all of patient's questions were answered to his expressed satisfaction. ? ?-- ?Edison Simon, PA-C ?[facility] ?03/08/2022, 6:59 AM ?[phone] ?M-F: 7am - 4pm ? ?

## 2022-03-08 NOTE — Anesthesia Preprocedure Evaluation (Addendum)
Anesthesia Evaluation  ?Patient identified by MRN, date of birth, ID band ?Patient awake ? ? ? ?Reviewed: ?Allergy & Precautions, NPO status , Patient's Chart, lab work & pertinent test results ? ?History of Anesthesia Complications ?Negative for: history of anesthetic complications ? ?Airway ?Mallampati: IV ? ? ?Neck ROM: Full ? ? ? Dental ?no notable dental hx. ? ?  ?Pulmonary ?Current Smoker (less than 1 ppd) and Patient abstained from smoking.,  ? Tachypnea  ? ? ? ? ? ? ? ? Cardiovascular ?Exercise Tolerance: Good ?negative cardio ROS ?Normal cardiovascular exam ?Rhythm:Regular Rate:Normal ? ? ?  ?Neuro/Psych ?negative neurological ROS ?   ? GI/Hepatic ?Admitted with perforated sigmoid diverticulitis, possibly ascites with abscess causing SBO ?  ?Endo/Other  ?Obesity  ? Renal/GU ?negative Renal ROS  ? ?  ?Musculoskeletal ? ? Abdominal ?  ?Peds ? Hematology ?negative hematology ROS ?(+)   ?Anesthesia Other Findings ? ? Reproductive/Obstetrics ? ?  ? ? ? ? ? ? ? ? ? ? ? ? ? ?  ?  ? ? ? ? ? ? ? ?Anesthesia Physical ?Anesthesia Plan ? ?ASA: 3 and emergent ? ?Anesthesia Plan: General  ? ?Post-op Pain Management:   ? ?Induction: Intravenous ? ?PONV Risk Score and Plan: 1 and Ondansetron, Dexamethasone and Treatment may vary due to age or medical condition ? ?Airway Management Planned: Oral ETT ? ?Additional Equipment:  ? ?Intra-op Plan:  ? ?Post-operative Plan: Extubation in OR and Possible Post-op intubation/ventilation ? ?Informed Consent: I have reviewed the patients History and Physical, chart, labs and discussed the procedure including the risks, benefits and alternatives for the proposed anesthesia with the patient or authorized representative who has indicated his/her understanding and acceptance.  ? ? ? ?Dental advisory given ? ?Plan Discussed with: CRNA ? ?Anesthesia Plan Comments: (Patient consented for risks of anesthesia including but not limited to:  ?- adverse reactions  to medications ?- damage to eyes, teeth, lips or other oral mucosa ?- nerve damage due to positioning  ?- sore throat or hoarseness ?- damage to heart, brain, nerves, lungs, other parts of body or loss of life ? ?Informed patient about role of CRNA in peri- and intra-operative care.  Patient voiced understanding.)  ? ? ? ? ? ?Anesthesia Quick Evaluation ? ?

## 2022-03-09 ENCOUNTER — Inpatient Hospital Stay: Payer: Commercial Managed Care - PPO

## 2022-03-09 ENCOUNTER — Encounter: Payer: Self-pay | Admitting: Surgery

## 2022-03-09 ENCOUNTER — Inpatient Hospital Stay: Payer: Self-pay

## 2022-03-09 LAB — COMPREHENSIVE METABOLIC PANEL
ALT: 34 U/L (ref 0–44)
AST: 62 U/L — ABNORMAL HIGH (ref 15–41)
Albumin: 2.5 g/dL — ABNORMAL LOW (ref 3.5–5.0)
Alkaline Phosphatase: 28 U/L — ABNORMAL LOW (ref 38–126)
Anion gap: 6 (ref 5–15)
BUN: 20 mg/dL (ref 6–20)
CO2: 28 mmol/L (ref 22–32)
Calcium: 7.5 mg/dL — ABNORMAL LOW (ref 8.9–10.3)
Chloride: 108 mmol/L (ref 98–111)
Creatinine, Ser: 0.96 mg/dL (ref 0.61–1.24)
GFR, Estimated: >60 mL/min (ref >60)
Glucose, Bld: 154 mg/dL — ABNORMAL HIGH (ref 70–99)
Potassium: 4 mmol/L (ref 3.5–5.1)
Sodium: 142 mmol/L (ref 135–145)
Total Bilirubin: 2.5 mg/dL — ABNORMAL HIGH (ref 0.3–1.2)
Total Protein: 5.8 g/dL — ABNORMAL LOW (ref 6.5–8.1)

## 2022-03-09 LAB — CBC
HCT: 33.3 % — ABNORMAL LOW (ref 39.0–52.0)
Hemoglobin: 10.6 g/dL — ABNORMAL LOW (ref 13.0–17.0)
MCH: 30.1 pg (ref 26.0–34.0)
MCHC: 31.8 g/dL (ref 30.0–36.0)
MCV: 94.6 fL (ref 80.0–100.0)
Platelets: 281 10*3/uL (ref 150–400)
RBC: 3.52 MIL/uL — ABNORMAL LOW (ref 4.22–5.81)
RDW: 12.5 % (ref 11.5–15.5)
WBC: 15 10*3/uL — ABNORMAL HIGH (ref 4.0–10.5)
nRBC: 0 % (ref 0.0–0.2)

## 2022-03-09 MED ORDER — SODIUM CHLORIDE 0.9% FLUSH
10.0000 mL | Freq: Two times a day (BID) | INTRAVENOUS | Status: DC
  Administered 2022-03-09 – 2022-03-16 (×13): 10 mL

## 2022-03-09 MED ORDER — TRAVASOL 10 % IV SOLN
INTRAVENOUS | Status: AC
  Filled 2022-03-09: qty 696

## 2022-03-09 MED ORDER — CHLORHEXIDINE GLUCONATE CLOTH 2 % EX PADS
6.0000 | MEDICATED_PAD | Freq: Every day | CUTANEOUS | Status: DC
  Administered 2022-03-09 – 2022-03-16 (×8): 6 via TOPICAL

## 2022-03-09 MED ORDER — DEXTROSE IN LACTATED RINGERS 5 % IV SOLN: INTRAVENOUS | Status: AC

## 2022-03-09 MED ORDER — INSULIN ASPART 100 UNIT/ML IJ SOLN
0.0000 [IU] | Freq: Four times a day (QID) | INTRAMUSCULAR | Status: DC
  Administered 2022-03-11 – 2022-03-14 (×6): 1 [IU] via SUBCUTANEOUS
  Filled 2022-03-09 (×6): qty 1

## 2022-03-09 MED ORDER — SODIUM CHLORIDE 0.9% FLUSH: 10.0000 mL | INTRAVENOUS | Status: DC | PRN

## 2022-03-09 NOTE — Progress Notes (Signed)
[facility] ?SURGICAL PROGRESS NOTE ? ?Hospital Day(s): 4.  ? ?Post op day(s): 1 Day Post-Op.  ? ?Interval History:  ?Patient seen and examined ?Overnight, he did pull his NGT. Has refused replacement ?Patient reports he is "feeling much much better" this morning ?He has some incisional soreness ?No fever, chills, nausea, emesis ?Leukocytosis improved to 15.0K (from 19.6K pre-operative) ?Hgb to 10.6; likely dilutional ?Renal function normal; sCr - 0.96; UO - 1.3L ?No significant electrolyte derangements ?RUQ surgical drain with 160 ccs out: serosanguinous ?RLQ surgical drain with 180 ccs out; serosanguinous ?He is NPO ?Continues on Zosyn  ? ?Vital signs in last 24 hours: [min-max] current  ?Temp:  [97.5 ?F (36.4 ?C)-99.9 ?F (37.7 ?C)] 97.6 ?F (36.4 ?C) (03/24 0335) ?Pulse Rate:  [108-138] 108 (03/24 0335) ?Resp:  [16-26] 16 (03/24 0335) ?BP: (107-161)/(77-102) 109/82 (03/24 0335) ?SpO2:  [95 %-100 %] 96 % (03/24 0335)     Height: 6' (182.9 cm) Weight: 104.3 kg BMI (Calculated): 31.19  ? ?Intake/Output last 2 shifts:  ?03/23 0701 - 03/24 0700 ?In: 5157.4 [I.V.:4318.6; IV Piggyback:838.8] ?Out: 2990 [Urine:1350; Drains:340; Blood:300]  ? ?Physical Exam:  ?Constitutional: alert, cooperative and no distress  ?Respiratory: breathing non-labored at rest  ?Cardiovascular: regular rate and sinus rhythm  ?Gastrointestinal: soft, expected incisional soreness, non-distended, no rebound/guarding. Colostomy in the left mid-abdomen, pick, patent, no gas or stool. Surgical drains in RUQ/RLQ both are serosanguinous ?Genitourinary: Foley in place ?Integumentary: Midline incision healing via secondary intention ? ?Labs:  ? ?  Latest Ref Rng & Units 03/09/2022  ?  5:21 AM 03/08/2022  ?  5:44 AM 03/07/2022  ?  5:39 AM  ?CBC  ?WBC 4.0 - 10.5 K/uL 15.0   19.6   13.4    ?Hemoglobin 13.0 - 17.0 g/dL 10.6   11.7   12.3    ?Hematocrit 39.0 - 52.0 % 33.3   36.7   37.5    ?Platelets 150 - 400 K/uL 281   300   285    ? ? ?  Latest  Ref Rng & Units 03/09/2022  ?  5:21 AM 03/08/2022  ?  5:44 AM 03/07/2022  ?  5:39 AM  ?CMP  ?Glucose 70 - 99 mg/dL 154   111   116    ?BUN 6 - 20 mg/dL '20   20   16    '$ ?Creatinine 0.61 - 1.24 mg/dL 0.96   1.15   0.97    ?Sodium 135 - 145 mmol/L 142   140   139    ?Potassium 3.5 - 5.1 mmol/L 4.0   3.9   3.4    ?Chloride 98 - 111 mmol/L 108   106   111    ?CO2 22 - 32 mmol/L '28   26   20    '$ ?Calcium 8.9 - 10.3 mg/dL 7.5   7.9   7.7    ?Total Protein 6.5 - 8.1 g/dL 5.8    6.8    ?Total Bilirubin 0.3 - 1.2 mg/dL 2.5    2.4    ?Alkaline Phos 38 - 126 U/L 28    34    ?AST 15 - 41 U/L 62    17    ?ALT 0 - 44 U/L 34    18    ? ? ? ?Imaging studies:  ? ?KUB (03/09/2022) personally reviewed which shows gastric distension but overall improvement in small bowel dilation, and radiologist report reviewed below:  ?IMPRESSION: ?1. Resolved small bowel obstruction. ?2.  Postsurgical changes with new colostomy and surgical drains in ?the left upper and lower quadrants. ?3. Left pleural effusion. ? ? ?Assessment/Plan:  ?41 y.o. male 1 Day Post-Op s/p exploratory laparotomy, colectomy, colostomy, and appendectomy for perforated diverticulitis with feculent peritonitis.  ? ? - For now, as he is not having any nausea/emesis/distension, we will hold off on replacing NGT unless these symptoms develop. He [place] understanding of this.  ? - He will need to continue to be NPO. Anticipate he will have ileus given nature of his intra-operative findings and disease process.  ? - Will consult for PICC and TPN this morning in anticipation of prolonged NPO period ? - Will continue foley today; Anticipate discontinuation of this tomorrow ? - Continue surgical drains; monitor and record output ? - Monitor abdominal examination ? - Pain control prn; antiemetics prn ? - Mobilization as tolerated   ? ?All of the above findings and recommendations were discussed with the patient, patient's family, and the medical team, and all of patient's and family's questions  were answered to their expressed satisfaction. ? ?-- ?Edison Simon, PA-C ?[facility] ?03/09/2022, 7:31 AM ?[phone] ?M-F: 7am - 4pm ?

## 2022-03-09 NOTE — Progress Notes (Signed)
Peripherally Inserted Central Catheter Placement ? ?The IV Nurse has discussed with the patient and/or persons authorized to consent for the patient, the purpose of this procedure and the potential benefits and risks involved with this procedure.  The benefits include less needle sticks, lab draws from the catheter, and the patient may be discharged home with the catheter. Risks include, but not limited to, infection, bleeding, blood clot (thrombus formation), and puncture of an artery; nerve damage and irregular heartbeat and possibility to perform a PICC exchange if needed/ordered by physician.  Alternatives to this procedure were also discussed.  Bard Power PICC patient education guide, fact sheet on infection prevention and patient information card has been provided to patient /or left at bedside.   ? ?PICC Placement Documentation  ?PICC Double Lumen 42/59/56 Right Basilic 47 cm 0 cm (Active)  ?Indication for Insertion or Continuance of Line Administration of hyperosmolar/irritating solutions (i.e. TPN, Vancomycin, etc.) 03/09/22 2019  ?Exposed Catheter (cm) 0 cm 03/09/22 2019  ?Site Assessment Clean, Dry, Intact 03/09/22 2019  ?Lumen #1 Status Blood return noted;Flushed;Saline locked 03/09/22 2019  ?Lumen #2 Status Blood return noted;Flushed;Saline locked 03/09/22 2019  ?Dressing Type Transparent;Securing device 03/09/22 2019  ?Dressing Status Clean, Dry, Intact;Antimicrobial disc in place 03/09/22 2019  ?Safety Lock Not Applicable 38/75/64 3329  ?Line Care Connections checked and tightened 03/09/22 2019  ?Line Adjustment (NICU/IV Team Only) No 03/09/22 2019  ?Dressing Intervention New dressing 03/09/22 2019  ?Dressing Change Due 03/16/22 03/09/22 2019  ? ? ? ? ? ?Aldona Lento L ?03/09/2022, 8:39 PM ? ?

## 2022-03-09 NOTE — Progress Notes (Signed)
Mobility Specialist - Progress Note ? ? 03/09/22 1200  ?Mobility  ?Activity Ambulated with assistance in hallway;Transferred from bed to chair  ?Level of Assistance Standby assist, set-up cues, supervision of patient - no hands on  ?Assistive Device Other (Comment) ?(IV pole)  ?Distance Ambulated (ft) 145 ft  ?Activity Response Tolerated well  ?$Mobility charge 1 Mobility  ? ? ? ?Pt lying in bed upon arrival, utilizing 2L. Pt educated on importance of OOB activity post-procedure. Supervision to ambulate and perform bed mobility. No complaints. Pt transferred to recliner with needs in reach.  ? ? ?Kathee Delton ?Mobility Specialist ?03/09/22, 1:02 PM ? ? ? ?

## 2022-03-09 NOTE — Progress Notes (Signed)
PHARMACY - TOTAL PARENTERAL NUTRITION CONSULT NOTE  ? ?Indication: Prolonged ileus ? ?Patient Measurements: ?Height: 6' (182.9 cm) ?Weight: 104.3 kg (230 lb) ?IBW/kg (Calculated) : 77.6 ?TPN AdjBW (KG): 84.3 ?Body mass index is 31.19 kg/m?. ? ?Assessment: 41 y.o. male w/ no pertinent PMH s/p exploratory laparotomy, colectomy, colostomy, and appendectomy for perforated diverticulitis with feculent peritonitis.  ? ?Glucose / Insulin: does not require insulin  BG<180 ?Electrolytes: wnl ?Renal: SCr 1.04-->1.39-->0.96 ?Hepatic: AST wnl, ALT trending up ?Intake / Output; MIVF: dextrose 5 % in lactated ringers infusion at 125 mL/hr ?GI Imaging: ?KUB (03/09/2022)  ?IMPRESSION: ?1. Resolved small bowel obstruction. ?2. Postsurgical changes with new colostomy and surgical drains in ?the left upper and lower quadrants. ?3. Left pleural effusion. ? ?GI Surgeries / Procedures: s/p exploratory laparotomy, colectomy, colostomy, and appendectomy 03/08/22 ? ?Central access: 03/09/22 (pending) ?TPN start date: 03/09/22 ? ?Nutritional Goals: ?Goal TPN rate is 100 mL/hr (provides 139 g of protein and 2479 kcals per day) ? ?RD Assessment: 2400-2600 kcals, 125-150 grams protein, and > 2 L fluid ?  ?Current Nutrition:  ?NPO ? ?Plan:  ?Start TPN at 72m/hr at 1800 (total volume including overfill 1300 mL) ?Nutritional components ?Amino acids (using 10% Travasol): 69.6 grams ?Dextrose: 168 grams ?Lipids (using 20% SMOFlipids): 39 grams ?kCal: 1239.6 ?Electrolytes in TPN (standard): Na 540m/L, K 5030mL, Ca 5mE85m, Mg 5mEq41m and Phos 15mmo60m Cl:Ac 1:1 ?Add standard MVI and trace elements to TPN ?Initiate Sensitive q6h SSI and adjust as needed  ?Reduce MIVF to 75 mL/hr at 1800 ?Monitor TPN labs on Mon/Thurs, daily until stable ? ?RodneyDallie Piles/2023,7:39 AM ? ?

## 2022-03-09 NOTE — Plan of Care (Signed)
?  Problem: Clinical Measurements: ?Goal: Ability to maintain clinical measurements within normal limits will improve ?Outcome: Progressing ?Goal: Will remain free from infection ?Outcome: Progressing ?Goal: Diagnostic test results will improve ?Outcome: Progressing ?Goal: Respiratory complications will improve ?Outcome: Progressing ?Goal: Cardiovascular complication will be avoided ?Outcome: Progressing ?  ?Pt is alert and orientedx4. V/S stable. Scheduled tylenol and toradol given for pain with relief. FC, Jp drainx3 and colostomy in place.  ?

## 2022-03-09 NOTE — Progress Notes (Signed)
Mobility Specialist - Progress Note ? ? 03/09/22 1400  ?Mobility  ?Activity Ambulated independently in hallway;Stood at bedside;Transferred from bed to chair  ?Level of Assistance Standby assist, set-up cues, supervision of patient - no hands on  ?Assistive Device Other (Comment) ?(IV pole)  ?Distance Ambulated (ft) 145 ft  ?Activity Response Tolerated well  ?$Mobility charge 1 Mobility  ? ? ? ?+2 with mobility spec for equipment management. ? ?Erik Johns ?Mobility Specialist ?03/09/22, 2:21 PM ? ? ? ? ?

## 2022-03-09 NOTE — Progress Notes (Signed)
Spoke with bedside RN to inform her that PICC would be placed tonight sometime after 1900. Will continue to monitor. ?

## 2022-03-09 NOTE — Anesthesia Postprocedure Evaluation (Signed)
Anesthesia Post Note ? ?Patient: Erik Johns ? ?Procedure(s) Performed: EXPLORATORY LAPAROTOMY ?COLECTOMY WITH COLOSTOMY CREATION/HARTMANN PROCEDURE ?APPENDECTOMY ? ?Patient location during evaluation: PACU ?Anesthesia Type: General ?Level of consciousness: awake and alert ?Pain management: pain level controlled ?Vital Signs Assessment: post-procedure vital signs reviewed and stable ?Respiratory status: spontaneous breathing, respiratory function stable and patient connected to nasal cannula oxygen ?Cardiovascular status: stable and tachycardic ?Postop Assessment: no apparent nausea or vomiting ?Anesthetic complications: no ?Comments: Tachycardia has been present since admission. Recent ABG drawn at the end of the case did not show acidosis.  ? ? ?No notable events documented. ? ? ?Last Vitals:  ?Vitals:  ? 03/08/22 2213 03/09/22 0335  ?BP: 107/84 109/82  ?Pulse: (!) 114 (!) 108  ?Resp: 20 16  ?Temp: 36.4 ?C 36.4 ?C  ?SpO2: 97% 96%  ?  ?Last Pain:  ?Vitals:  ? 03/09/22 0335  ?TempSrc: Oral  ?PainSc:   ? ? ?  ?  ?  ?  ?  ?  ? ?[place] Ouch ? ? ? ? ?

## 2022-03-09 NOTE — Progress Notes (Signed)
Initial Nutrition Assessment ? ?DOCUMENTATION CODES:  ? ?Obesity unspecified ? ?INTERVENTION:  ? ?-TPN management per pharmacy ? ?NUTRITION DIAGNOSIS:  ? ?Increased nutrient needs related to post-op healing as evidenced by estimated needs. ? ?GOAL:  ? ?Patient will meet greater than or equal to 90% of their needs ? ?MONITOR:  ? ?Labs, Weight trends, Skin, I & O's ? ?REASON FOR ASSESSMENT:  ? ?Consult ?New TPN/TNA ? ?ASSESSMENT:  ? ?Erik Johns is a 41 y.o. male presenting with a 3 day history of generalized cramping abdominal pain.  The patient noticed some mild pain in the left lower quadrant starting the night of 03/02/22.  He went to work and thought that he might be constipated.  He has had some issues with constipation in the past and taking a laxative has helped.  He had liquid bowel movements but his pain did not resolve.  On 3/18, the pain remained similar, but on the night of 3/19, his pain became worse.  He went to work again, but ended up coming to the ER due to worsening pain.  Denies any fevers but reports feeling chills.  Denies any nausea or vomiting, chest pain, shortness of breath.  In the ER, his workup showed a WBC of 17.4.  He had a CT scan of abdomen/pelvis which showed perforated sigmoid diverticulitis, with inflammatory changes of the sigmoid colon with a few bubbles of gas in pericolonic region.  No diffuse free air, no abscess.  I have personally viewed the images.  The patient denies any prior episodes. ? ?Pt admitted with perforated sigmoid diverticulitis.  ? ?3/23- s/p PROCEDURES: ?1. Drainage of intra-abdominal abscess interloop ?2. Reduction of internal Hernia ?3. 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 Splenic Flexure takedown ?5. Appendectomy ?6. Partial omentectomy ?3/24- pulled NGT overnight and refused replacement ? ?Reviewed I/O's: +2.2 L x 24 hours and +10.6 L since admission ? ?Drain output: 340 ml x 24 hours ? ?Spoke with pt at  bedside, who reports feeling better today. He admits that he removed his NGT last night, but does not remember doing it.  ? ?Pt shares that he had a good appetite PTA, usually consumes 2 meals per day. Pt shares that he felt well up until 03/03/22, when he felt constipated after eating hamburger from [employer]. The day after, he shares that he consumed only liquids due to discomfort, but then brought himself to hospital secondary to pain. Pt shares that his last PO intake was 03/04/21 and suspects he has lost weight secondary to NPO status.  ? ?Reviewed wt hx; no wt hx available to assess at this time. Per pt, UBW is around 230#. ? ?Case discussed with pharmacy; plan to start TPN today secondary to suspect post-op ileus. TPN will be initiated at 50 ml/hr, which provides 1240 kcals and 70 grams of protein, meeting 52% of estimated kcal needs and 56% of estimated protein needs.  ? ?Discussed rationale for NPO status and explained forn pt will receive nutrition at this time.  ? ?Medications reviewed and include dextrose 5% in lactated ringers infusion @ 75 ml/hr.   ? ?Labs reviewed: CBGS: 147 (inpatient orders for glycemic control are 0-9 units insulin aspart every 6 hours).   ? ?NUTRITION - FOCUSED PHYSICAL EXAM: ? ?Flowsheet Row Most Recent Value  ?Orbital Region No depletion  ?Upper Arm Region No depletion  ?Thoracic and Lumbar Region No depletion  ?Buccal Region No depletion  ?Temple Region No depletion  ?Clavicle  Bone Region No depletion  ?Clavicle and Acromion Bone Region No depletion  ?Scapular Bone Region No depletion  ?Dorsal Hand No depletion  ?Patellar Region No depletion  ?Anterior Thigh Region No depletion  ?Posterior Calf Region No depletion  ?Edema (RD Assessment) None  ?Hair Reviewed  ?Eyes Reviewed  ?Mouth Reviewed  ?Skin Reviewed  ?Nails Reviewed  ? ?  ? ? ?Diet Order:   ?Diet Order   ? ?       ?  Diet NPO time specified Except for: Sips with Meds  Diet effective now       ?  ? ?  ?  ? ?  ? ? ?EDUCATION  NEEDS:  ? ?Education needs have been addressed ? ?Skin:  Skin Assessment: Skin Integrity Issues: ?Skin Integrity Issues:: Incisions ?Incisions: closed abdomen ? ?Last BM:  03/07/22 ? ?Height:  ? ?Ht Readings from Last 1 Encounters:  ?03/04/22 6' (1.829 m)  ? ? ?Weight:  ? ?Wt Readings from Last 1 Encounters:  ?03/04/22 104.3 kg  ? ? ?Ideal Body Weight:  80.9 kg ? ?BMI:  Body mass index is 31.19 kg/m?. ? ?Estimated Nutritional Needs:  ? ?Kcal:  2400-2600 ? ?Protein:  125-150 grams ? ?Fluid:  > 2 L ? ? ? ?Loistine Chance, RD, LDN, CDCES ?Registered Dietitian II ?Certified Diabetes Care and Education Specialist ?Please refer to [facility] for RD and/or RD on-call/weekend/after hours pager  ?

## 2022-03-10 LAB — CBC
HCT: 27.3 % — ABNORMAL LOW (ref 39.0–52.0)
Hemoglobin: 8.7 g/dL — ABNORMAL LOW (ref 13.0–17.0)
MCH: 30 pg (ref 26.0–34.0)
MCHC: 31.9 g/dL (ref 30.0–36.0)
MCV: 94.1 fL (ref 80.0–100.0)
Platelets: 307 10*3/uL (ref 150–400)
RBC: 2.9 MIL/uL — ABNORMAL LOW (ref 4.22–5.81)
RDW: 12.6 % (ref 11.5–15.5)
WBC: 16.8 10*3/uL — ABNORMAL HIGH (ref 4.0–10.5)
nRBC: 0.1 % (ref 0.0–0.2)

## 2022-03-10 LAB — COMPREHENSIVE METABOLIC PANEL
ALT: 30 U/L (ref 0–44)
AST: 50 U/L — ABNORMAL HIGH (ref 15–41)
Albumin: 2.2 g/dL — ABNORMAL LOW (ref 3.5–5.0)
Alkaline Phosphatase: 32 U/L — ABNORMAL LOW (ref 38–126)
Anion gap: 6 (ref 5–15)
BUN: 23 mg/dL — ABNORMAL HIGH (ref 6–20)
CO2: 32 mmol/L (ref 22–32)
Calcium: 7.6 mg/dL — ABNORMAL LOW (ref 8.9–10.3)
Chloride: 105 mmol/L (ref 98–111)
Creatinine, Ser: 0.66 mg/dL (ref 0.61–1.24)
GFR, Estimated: >60 mL/min (ref >60)
Glucose, Bld: 118 mg/dL — ABNORMAL HIGH (ref 70–99)
Potassium: 3.8 mmol/L (ref 3.5–5.1)
Sodium: 143 mmol/L (ref 135–145)
Total Bilirubin: 2 mg/dL — ABNORMAL HIGH (ref 0.3–1.2)
Total Protein: 5.5 g/dL — ABNORMAL LOW (ref 6.5–8.1)

## 2022-03-10 LAB — GLUCOSE, CAPILLARY
Glucose-Capillary: 112 mg/dL — ABNORMAL HIGH (ref 70–99)
Glucose-Capillary: 118 mg/dL — ABNORMAL HIGH (ref 70–99)
Glucose-Capillary: 127 mg/dL — ABNORMAL HIGH (ref 70–99)
Glucose-Capillary: 136 mg/dL — ABNORMAL HIGH (ref 70–99)

## 2022-03-10 LAB — PHOSPHORUS: Phosphorus: 2 mg/dL — ABNORMAL LOW (ref 2.5–4.6)

## 2022-03-10 LAB — MAGNESIUM: Magnesium: 2.6 mg/dL — ABNORMAL HIGH (ref 1.7–2.4)

## 2022-03-10 LAB — TRIGLYCERIDES: Triglycerides: 161 mg/dL — ABNORMAL HIGH (ref <150)

## 2022-03-10 MED ORDER — TRAVASOL 10 % IV SOLN
INTRAVENOUS | Status: AC
  Filled 2022-03-10: qty 1392

## 2022-03-10 MED ORDER — DEXTROSE IN LACTATED RINGERS 5 % IV SOLN: INTRAVENOUS | Status: DC

## 2022-03-10 MED ORDER — TRAVASOL 10 % IV SOLN: INTRAVENOUS | Status: DC

## 2022-03-10 NOTE — Plan of Care (Signed)
  Problem: Activity: Goal: Risk for activity intolerance will decrease Outcome: Progressing   Problem: Coping: Goal: Level of anxiety will decrease Outcome: Progressing   Problem: Pain Managment: Goal: General experience of comfort will improve Outcome: Progressing   Problem: Safety: Goal: Ability to remain free from injury will improve Outcome: Progressing   Problem: Skin Integrity: Goal: Risk for impaired skin integrity will decrease Outcome: Progressing   

## 2022-03-10 NOTE — Progress Notes (Signed)
[facility] ?SURGICAL PROGRESS NOTE ? ?Hospital Day(s): 5.  ? ?Post op day(s): 2 Days Post-Op.  ? ?Interval History:  ?Patient seen and examined ?Patient reports he is "feeling much much better" this morning ?He has some incisional pain and can't get his medication speedily enough ?No fever, chills, nausea, emesis ?Leukocytosis stable at 16.8 ?Hgb down to 8.7; likely dilutional ?Renal function normal; sCr - Normal; UO - 2L ?No significant electrolyte derangements ?RUQ surgical drain: serosanguinous ?RLQ surgical drain; serosanguinous ?He is NPO ?Continues on Zosyn  ? ?Vital signs in last 24 hours: [min-max] current  ?Temp:  [97.8 ?F (36.6 ?C)-99 ?F (37.2 ?C)] 98.4 ?F (36.9 ?C) (03/25 0805) ?Pulse Rate:  [91-99] 91 (03/25 0805) ?Resp:  [18-20] 18 (03/25 0805) ?BP: (131-144)/(89-103) 141/89 (03/25 0805) ?SpO2:  [99 %-100 %] 100 % (03/25 0805)     Height: 6' (182.9 cm) Weight: 104.3 kg BMI (Calculated): 31.19  ? ?Intake/Output last 2 shifts:  ?03/24 0701 - 03/25 0700 ?In: 1206.6 [P.O.:60; I.V.:1021.1; IV Piggyback:125.5] ?Out: 2235 [SELTR:3202; Drains:285]  ? ?Physical Exam:  ?Constitutional: alert, cooperative and no distress  ?Respiratory: breathing non-labored at rest  ?Cardiovascular: regular rate and sinus rhythm  ?Gastrointestinal: soft, expected incisional soreness, non-distended, no rebound/guarding. Colostomy in the left mid-abdomen, pink, patent, no gas or stool. Surgical drains in RUQ/RLQ both are serosanguinous ?Genitourinary: Foley in place ?Integumentary: Midline incision widely open with clean sub-cutaneous adipose, and no discharge, or marginally viable tissues, healing via secondary intention ? ?Labs:  ? ?  Latest Ref Rng & Units 03/10/2022  ?  6:00 AM 03/09/2022  ?  5:21 AM 03/08/2022  ?  5:44 AM  ?CBC  ?WBC 4.0 - 10.5 K/uL 16.8   15.0   19.6    ?Hemoglobin 13.0 - 17.0 g/dL 8.7   10.6   11.7    ?Hematocrit 39.0 - 52.0 % 27.3   33.3   36.7    ?Platelets 150 - 400 K/uL 307   281   300     ? ? ?  Latest Ref Rng & Units 03/10/2022  ?  6:00 AM 03/09/2022  ?  5:21 AM 03/08/2022  ?  5:44 AM  ?CMP  ?Glucose 70 - 99 mg/dL 118   154   111    ?BUN 6 - 20 mg/dL '23   20   20    '$ ?Creatinine 0.61 - 1.24 mg/dL 0.66   0.96   1.15    ?Sodium 135 - 145 mmol/L 143   142   140    ?Potassium 3.5 - 5.1 mmol/L 3.8   4.0   3.9    ?Chloride 98 - 111 mmol/L 105   108   106    ?CO2 22 - 32 mmol/L 32   28   26    ?Calcium 8.9 - 10.3 mg/dL 7.6   7.5   7.9    ?Total Protein 6.5 - 8.1 g/dL 5.5   5.8     ?Total Bilirubin 0.3 - 1.2 mg/dL 2.0   2.5     ?Alkaline Phos 38 - 126 U/L 32   28     ?AST 15 - 41 U/L 50   62     ?ALT 0 - 44 U/L 30   34     ? ? ? ?Imaging studies:  ? ?KUB (03/09/2022) personally reviewed which shows gastric distension but overall improvement in small bowel dilation, and radiologist report reviewed below:  ?IMPRESSION: ?1. Resolved small bowel obstruction. ?  2. Postsurgical changes with new colostomy and surgical drains in ?the left upper and lower quadrants. ?3. Left pleural effusion. ? ? ?Assessment/Plan:  ?41 y.o. male 2 Days Post-Op s/p exploratory laparotomy, colectomy, colostomy, and appendectomy for perforated diverticulitis with feculent peritonitis.  ? ? - For now, as he is not having any nausea/emesis/distension, he will need to continue to be NPO.  Consider sips only as a start.  Anticipate he will have ileus given nature of his intra-operative findings and disease process.  ? - Increase TPN to goal of 171m/hr in anticipation of prolonged NPO period ? - Will discontinue foley today;  ? - Continue surgical drains; monitor and record output ? - Monitor abdominal examination ? - Pain control prn; antiemetics prn ? - Mobilization as tolerated   ? ?All of the above findings and recommendations were discussed with the patient, patient's family, and the medical team, and all of patient's and family's questions were answered to their expressed satisfaction. ? ?-- ?DRonny Bacon M.D., FACS ?[facility] ? ?03/10/2022 ; 12:44 PM ? ?M-F: 7am - 4pm ?

## 2022-03-10 NOTE — Progress Notes (Signed)
Mobility Specialist - Progress Note ? ? ? 03/10/22 1500  ?Mobility  ?Activity Ambulated independently in hallway;Stood at bedside;Dangled on edge of bed  ?Level of Assistance Independent  ?Assistive Device Other (Comment) ?(IV Pole)  ?Distance Ambulated (ft) 160 ft  ?Activity Response Tolerated well  ?$Mobility charge 1 Mobility  ? ? ?Pt supine upon arrival with [place] out of nose using RA, family at bedside. Pt completes bed mobility ModI with HOB elevated. Pt voices 6/10 abdominal soreness. Completes STS with supervision and ambulates 167f indep. Returns to EOB voicing urge to urinate, but foley not "letting him go". RN notified for foley care. Pt is left EOB with needs in reach and RN OMinette Brinepresent. ? ?MMerrily Brittle?Mobility Specialist ?03/10/22, 3:14 PM ? ? ? ? ?

## 2022-03-10 NOTE — Plan of Care (Signed)
?  Problem: Clinical Measurements: ?Goal: Will remain free from infection ?Outcome: Progressing ?  ?Problem: Nutrition: ?Goal: Adequate nutrition will be maintained ?Outcome: Progressing ?  ?Problem: Pain Managment: ?Goal: General experience of comfort will improve ?Outcome: Progressing ?  ?

## 2022-03-10 NOTE — Progress Notes (Signed)
PHARMACY - TOTAL PARENTERAL NUTRITION CONSULT NOTE  ? ?Indication: Prolonged ileus ? ?Patient Measurements: ?Height: 6' (182.9 cm) ?Weight: 104.3 kg (230 lb) ?IBW/kg (Calculated) : 77.6 ?TPN AdjBW (KG): 84.3 ?Body mass index is 31.19 kg/m?. ? ?Assessment: 41 y.o. male w/ no pertinent PMH s/p exploratory laparotomy, colectomy, colostomy, and appendectomy for perforated diverticulitis with feculent peritonitis.  ? ?Glucose / Insulin: does not require insulin  BG<180 ?Electrolytes: Phos 2.0, Mag 2.6(elevated) ?Renal: SCr 1.04-->1.39-->0.66 ?Hepatic: AST/ALT wnl,  ?Intake / Output; MIVF: dextrose 5 % in lactated ringers infusion at 75 mL/hr ?GI Imaging: ?KUB (03/09/2022)  ?IMPRESSION: ?1. Resolved small bowel obstruction. ?2. Postsurgical changes with new colostomy and surgical drains in ?the left upper and lower quadrants. ?3. Left pleural effusion. ? ?GI Surgeries / Procedures: s/p exploratory laparotomy, colectomy, colostomy, and appendectomy 03/08/22 ? ?Central access: 03/09/22 (pending) ?TPN start date: 03/09/22 ? ?Nutritional Goals: ?Goal TPN rate is 100 mL/hr (provides 139 g of protein and 2479 kcals per day) ? ?RD Assessment: 2400-2600 kcals, 125-150 grams protein, and > 2 L fluid ?Estimated Needs ?Total Energy Estimated Needs: 2400-2600 ?Total Protein Estimated Needs: 125-150 grams ?Total Fluid Estimated Needs: > 2 L ?Current Nutrition:  ?NPO ? ?Plan:  ?Increase TPN to 149m/hr at 1800 (total volume including overfill 2400 mL) ?Nutritional components ?Amino acids (using 10% Travasol): 139.2 grams ?Dextrose: 336 grams ?Lipids (using 20% SMOFlipids): 66 grams ?kCal: 2359.2 ?Electrolytes in TPN (standard): Na 569m/L, K 5071mL, Ca 5mE30m, Mg 0mEq66m and Phos 25mmo58m Cl:Ac 1:1 ?Add standard MVI and trace elements to TPN ?Initiate Sensitive q6h SSI and adjust as needed  ?Reduce MIVF to 25 mL/hr at 1800 ?Monitor TPN labs on Mon/Thurs, daily until stable ? ?Erik Johns ?03/10/2022,8:16 AM ? ?

## 2022-03-11 LAB — CULTURE, BLOOD (ROUTINE X 2)
Culture: NO GROWTH
Culture: NO GROWTH

## 2022-03-11 LAB — BASIC METABOLIC PANEL
Anion gap: 7 (ref 5–15)
BUN: 19 mg/dL (ref 6–20)
CO2: 29 mmol/L (ref 22–32)
Calcium: 7.9 mg/dL — ABNORMAL LOW (ref 8.9–10.3)
Chloride: 103 mmol/L (ref 98–111)
Creatinine, Ser: 0.74 mg/dL (ref 0.61–1.24)
GFR, Estimated: >60 mL/min (ref >60)
Glucose, Bld: 117 mg/dL — ABNORMAL HIGH (ref 70–99)
Potassium: 3.6 mmol/L (ref 3.5–5.1)
Sodium: 139 mmol/L (ref 135–145)

## 2022-03-11 LAB — GLUCOSE, CAPILLARY
Glucose-Capillary: 111 mg/dL — ABNORMAL HIGH (ref 70–99)
Glucose-Capillary: 115 mg/dL — ABNORMAL HIGH (ref 70–99)
Glucose-Capillary: 123 mg/dL — ABNORMAL HIGH (ref 70–99)
Glucose-Capillary: 140 mg/dL — ABNORMAL HIGH (ref 70–99)

## 2022-03-11 LAB — PHOSPHORUS: Phosphorus: 3.1 mg/dL (ref 2.5–4.6)

## 2022-03-11 LAB — MAGNESIUM: Magnesium: 1.8 mg/dL (ref 1.7–2.4)

## 2022-03-11 MED ORDER — CYCLOBENZAPRINE HCL 10 MG PO TABS
10.0000 mg | ORAL_TABLET | Freq: Three times a day (TID) | ORAL | Status: DC | PRN
  Administered 2022-03-11 – 2022-03-15 (×8): 10 mg via ORAL
  Filled 2022-03-11 (×8): qty 1

## 2022-03-11 MED ORDER — KETOROLAC TROMETHAMINE 30 MG/ML IJ SOLN
30.0000 mg | Freq: Four times a day (QID) | INTRAMUSCULAR | Status: AC
  Administered 2022-03-11 – 2022-03-16 (×20): 30 mg via INTRAVENOUS
  Filled 2022-03-11 (×20): qty 1

## 2022-03-11 MED ORDER — TRAVASOL 10 % IV SOLN
INTRAVENOUS | Status: AC
  Filled 2022-03-11: qty 1392

## 2022-03-11 MED ORDER — HYDROMORPHONE HCL 1 MG/ML IJ SOLN
1.0000 mg | INTRAMUSCULAR | Status: DC | PRN
  Administered 2022-03-12 – 2022-03-15 (×4): 1 mg via INTRAVENOUS
  Filled 2022-03-11 (×4): qty 1

## 2022-03-11 MED ORDER — OXYCODONE-ACETAMINOPHEN 5-325 MG PO TABS
1.0000 | ORAL_TABLET | Freq: Four times a day (QID) | ORAL | Status: DC | PRN
  Administered 2022-03-11 – 2022-03-15 (×6): 2 via ORAL
  Administered 2022-03-16 (×2): 1 via ORAL
  Filled 2022-03-11: qty 2
  Filled 2022-03-11: qty 1
  Filled 2022-03-11 (×4): qty 2
  Filled 2022-03-11: qty 1
  Filled 2022-03-11 (×2): qty 2

## 2022-03-11 NOTE — Plan of Care (Signed)

## 2022-03-11 NOTE — Plan of Care (Signed)
  Problem: Clinical Measurements: Goal: Will remain free from infection Outcome: Progressing   Problem: Activity: Goal: Risk for activity intolerance will decrease Outcome: Progressing   Problem: Nutrition: Goal: Adequate nutrition will be maintained Outcome: Progressing   Problem: Pain Managment: Goal: General experience of comfort will improve Outcome: Progressing   

## 2022-03-11 NOTE — Progress Notes (Signed)
[facility] ?SURGICAL PROGRESS NOTE ? ?Hospital Day(s): 6.  ? ?Post op day(s): 3 Days Post-Op.  ? ?Interval History:  ?Patient seen and examined ?Patient reports he just received pain medication, but was in significant pain prior to this ?He complains of incisional pain and can't get his medication speedily enough ?Low grade fever. No chills, nausea, emesis ?Renal function normal; BUN/sCr - Normal; UO - 2.4L ?No significant electrolyte derangements ?RUQ surgical drain: serous ?RLQ surgical drain; serous ?He is NPO, reported minimal gas in appliance, with piece of stool. ?Continues on Zosyn  ? ?Vital signs in last 24 hours: [min-max] current  ?Temp:  [98.3 ?F (36.8 ?C)-100.8 ?F (38.2 ?C)] 99.6 ?F (37.6 ?C) (03/26 6222) ?Pulse Rate:  [89-99] 98 (03/26 0803) ?Resp:  [18] 18 (03/26 0803) ?BP: (127-150)/(80-95) 148/85 (03/26 0803) ?SpO2:  [97 %-99 %] 97 % (03/26 0803)     Height: 6' (182.9 cm) Weight: 104.3 kg BMI (Calculated): 31.19  ? ?Intake/Output last 2 shifts:  ?03/25 0701 - 03/26 0700 ?In: 1264.7 [I.V.:1093.1; IV Piggyback:171.6] ?Out: 9798 [Urine:2400; Drains:85; Stool:50]  ? ?Physical Exam:  ?Constitutional: alert, cooperative and no distress  ?Respiratory: breathing non-labored at rest  ?Cardiovascular: regular rate and sinus rhythm  ?Gastrointestinal: soft, expected incisional soreness, non-distended, no rebound/guarding. Colostomy in the left mid-abdomen, edematous, pink, patent, no gas or stool in bag. Surgical drains in RUQ/RLQ both are serous. ?Genitourinary: Foley in place ?Integumentary: Midline incision widely open with clean, moist, sub-cutaneous adipose, clean viable dermal perimeter, and no discharge, nor marginally viable tissues, minimal progress healing via secondary intention ? ?Labs:  ? ?  Latest Ref Rng & Units 03/10/2022  ?  6:00 AM 03/09/2022  ?  5:21 AM 03/08/2022  ?  5:44 AM  ?CBC  ?WBC 4.0 - 10.5 K/uL 16.8   15.0   19.6    ?Hemoglobin 13.0 - 17.0 g/dL 8.7   10.6   11.7     ?Hematocrit 39.0 - 52.0 % 27.3   33.3   36.7    ?Platelets 150 - 400 K/uL 307   281   300    ? ? ?  Latest Ref Rng & Units 03/11/2022  ?  3:50 AM 03/10/2022  ?  6:00 AM 03/09/2022  ?  5:21 AM  ?CMP  ?Glucose 70 - 99 mg/dL 117   118   154    ?BUN 6 - 20 mg/dL '19   23   20    '$ ?Creatinine 0.61 - 1.24 mg/dL 0.74   0.66   0.96    ?Sodium 135 - 145 mmol/L 139   143   142    ?Potassium 3.5 - 5.1 mmol/L 3.6   3.8   4.0    ?Chloride 98 - 111 mmol/L 103   105   108    ?CO2 22 - 32 mmol/L 29   32   28    ?Calcium 8.9 - 10.3 mg/dL 7.9   7.6   7.5    ?Total Protein 6.5 - 8.1 g/dL  5.5   5.8    ?Total Bilirubin 0.3 - 1.2 mg/dL  2.0   2.5    ?Alkaline Phos 38 - 126 U/L  32   28    ?AST 15 - 41 U/L  50   62    ?ALT 0 - 44 U/L  30   34    ? ? ? ?Imaging studies:  ? ?KUB (03/09/2022) personally reviewed which shows gastric distension but overall improvement  in small bowel dilation, and radiologist report reviewed below:  ?IMPRESSION: ?1. Resolved small bowel obstruction. ?2. Postsurgical changes with new colostomy and surgical drains in ?the left upper and lower quadrants. ?3. Left pleural effusion. ? ? ?Assessment/Plan:  ?41 y.o. male 3 Days Post-Op s/p exploratory laparotomy, colectomy, colostomy, and appendectomy for perforated diverticulitis with feculent peritonitis.  ? ? - For now, as he is not having any nausea/emesis/distension, he will need to continue to be NPO.  Consider sips only as a start.  Anticipate he will have ileus given nature of his intra-operative findings and disease process.  ? - Increase TPN to goal of 112m/hr in anticipation of prolonged NPO period ? - Continue surgical drains; monitor and record output ? - Monitor abdominal examination ? - Pain medications significantly increased today, with shorter interval/increased frequency with higher doses and more alternatives to supplement pain control; antiemetics prn ? - Mobilization encouraged, and will likely, with better pain control. ? -We discussed his refusal  of Lovenox for DVT prophylaxis.  Especially in light of his immobility.  He took his SCDs off due to being hot. ? ?All of the above findings and recommendations were discussed with the patient, patient's family, and the medical team, and all of patient's and family's questions were answered to their expressed satisfaction. ? ?-- ?DRonny Bacon M.D., FACS ?[facility] ? ?03/11/2022 ; 12:55 PM ? ? ?

## 2022-03-11 NOTE — Progress Notes (Signed)
PHARMACY - TOTAL PARENTERAL NUTRITION CONSULT NOTE  ? ?Indication: Prolonged ileus ? ?Patient Measurements: ?Height: 6' (182.9 cm) ?Weight: 104.3 kg (230 lb) ?IBW/kg (Calculated) : 77.6 ?TPN AdjBW (KG): 84.3 ?Body mass index is 31.19 kg/m?. ? ?Assessment: 41 y.o. male w/ no pertinent PMH s/p exploratory laparotomy, colectomy, colostomy, and appendectomy for perforated diverticulitis with feculent peritonitis.  ? ?Glucose / Insulin: does not require insulin  BG<180 ?Electrolytes: Phos 3.1, Mag 1.8 ?Renal: SCr 1.04-->1.39-->0.74 ?Hepatic: AST/ALT wnl,  ?Intake / Output; MIVF: dextrose 5 % in lactated ringers infusion at 25 mL/hr, net +7.928L ?GI Imaging: ?KUB (03/09/2022)  ?IMPRESSION: ?1. Resolved small bowel obstruction. ?2. Postsurgical changes with new colostomy and surgical drains in ?the left upper and lower quadrants. ?3. Left pleural effusion. ? ?GI Surgeries / Procedures: s/p exploratory laparotomy, colectomy, colostomy, and appendectomy 03/08/22 ? ?Central access: 03/09/22 (pending) ?TPN start date: 03/09/22 ? ?Nutritional Goals: ?Goal TPN rate is 100 mL/hr (provides 139 g of protein and 2479 kcals per day) ? ?RD Assessment: 2400-2600 kcals, 125-150 grams protein, and > 2 L fluid ?Estimated Needs ?Total Energy Estimated Needs: 2400-2600 ?Total Protein Estimated Needs: 125-150 grams ?Total Fluid Estimated Needs: > 2 L ?Current Nutrition:  ?NPO ? ?Plan:  ?Continue TPN at goal rate of 191m/hr at 1800 (total volume including overfill 2400 mL) ?Nutritional components ?Amino acids (using 10% Travasol): 139.2 grams ?Dextrose: 336 grams ?Lipids (using 20% SMOFlipids): 66 grams ?kCal: 2359.2 ?Electrolytes in TPN: Na 576m/L, K 5072mL, Ca 5mE20m, Mg 5mEq32m and Phos 20mmo53m Cl:Ac 1:1 ?Add standard MVI and trace elements to TPN ?Initiate Sensitive q6h SSI and adjust as needed  ?Continue MIVF to 25 mL/hr at 1800 ?Monitor TPN labs on Mon/Thurs, daily until stable ? ?Georgia Delsignore A Kaylanni Ezelle ?03/11/2022,11:44 AM ? ?

## 2022-03-12 LAB — GLUCOSE, CAPILLARY
Glucose-Capillary: 122 mg/dL — ABNORMAL HIGH (ref 70–99)
Glucose-Capillary: 127 mg/dL — ABNORMAL HIGH (ref 70–99)
Glucose-Capillary: 129 mg/dL — ABNORMAL HIGH (ref 70–99)
Glucose-Capillary: 141 mg/dL — ABNORMAL HIGH (ref 70–99)
Glucose-Capillary: 154 mg/dL — ABNORMAL HIGH (ref 70–99)

## 2022-03-12 LAB — COMPREHENSIVE METABOLIC PANEL
ALT: 51 U/L — ABNORMAL HIGH (ref 0–44)
AST: 51 U/L — ABNORMAL HIGH (ref 15–41)
Albumin: 2.1 g/dL — ABNORMAL LOW (ref 3.5–5.0)
Alkaline Phosphatase: 51 U/L (ref 38–126)
Anion gap: 8 (ref 5–15)
BUN: 17 mg/dL (ref 6–20)
CO2: 27 mmol/L (ref 22–32)
Calcium: 7.9 mg/dL — ABNORMAL LOW (ref 8.9–10.3)
Chloride: 103 mmol/L (ref 98–111)
Creatinine, Ser: 0.65 mg/dL (ref 0.61–1.24)
GFR, Estimated: >60 mL/min (ref >60)
Glucose, Bld: 119 mg/dL — ABNORMAL HIGH (ref 70–99)
Potassium: 3.3 mmol/L — ABNORMAL LOW (ref 3.5–5.1)
Sodium: 138 mmol/L (ref 135–145)
Total Bilirubin: 2.9 mg/dL — ABNORMAL HIGH (ref 0.3–1.2)
Total Protein: 5.7 g/dL — ABNORMAL LOW (ref 6.5–8.1)

## 2022-03-12 LAB — CBC
HCT: 29.4 % — ABNORMAL LOW (ref 39.0–52.0)
Hemoglobin: 9.3 g/dL — ABNORMAL LOW (ref 13.0–17.0)
MCH: 29.8 pg (ref 26.0–34.0)
MCHC: 31.6 g/dL (ref 30.0–36.0)
MCV: 94.2 fL (ref 80.0–100.0)
Platelets: 396 10*3/uL (ref 150–400)
RBC: 3.12 MIL/uL — ABNORMAL LOW (ref 4.22–5.81)
RDW: 12.7 % (ref 11.5–15.5)
WBC: 24.5 10*3/uL — ABNORMAL HIGH (ref 4.0–10.5)
nRBC: 0.2 % (ref 0.0–0.2)

## 2022-03-12 LAB — SURGICAL PATHOLOGY

## 2022-03-12 LAB — MAGNESIUM: Magnesium: 1.7 mg/dL (ref 1.7–2.4)

## 2022-03-12 LAB — TRIGLYCERIDES: Triglycerides: 171 mg/dL — ABNORMAL HIGH (ref <150)

## 2022-03-12 LAB — PHOSPHORUS: Phosphorus: 4 mg/dL (ref 2.5–4.6)

## 2022-03-12 MED ORDER — BOOST / RESOURCE BREEZE PO LIQD CUSTOM
1.0000 | Freq: Three times a day (TID) | ORAL | Status: DC
Start: 2022-03-12 — End: 2022-03-13
  Administered 2022-03-12 (×4): 1 via ORAL

## 2022-03-12 MED ORDER — TRAVASOL 10 % IV SOLN
INTRAVENOUS | Status: AC
  Filled 2022-03-12: qty 1392

## 2022-03-12 MED ORDER — POTASSIUM CHLORIDE 10 MEQ/100ML IV SOLN
10.0000 meq | INTRAVENOUS | Status: AC
  Administered 2022-03-12 (×3): 10 meq via INTRAVENOUS
  Filled 2022-03-12: qty 100

## 2022-03-12 NOTE — Consult Note (Signed)
[facility] Nurse ostomy follow up ?Patient receiving care in [facility]. His mother, Freda Munro, present and participating in ostomy teaching/change session. ?Stoma type/location: LUQ colostomy ?Stomal assessment/size: 1 3/4 inches, round, moist, pink, edematous, sutures intact ?Peristomal assessment: intact ?Treatment options for stomal/peristomal skin: barrier ring may be needed in the future. Not used today. ?Output: air only, no stool ?Ostomy pouching: 2pc. Patient uses a two piece, 2 and 3/4 inch system. Pouch, Kellie Simmering [phone]; skin barrier, Kellie Simmering #2; may eventually need a barrier ring, Kellie Simmering [phone]. ?Education provided: Patient's mother participated in the pouch removal, new pouch skin barrier cutting to size, and application of pouching system. She opens/closes it very easily.  Nas observed but did not participate today. ?Enrolled patient in [place] Discharge program: Yes, today. ? ?Val Riles, RN, MSN, CWOCN, CNS-BC, pager [phone]  ?

## 2022-03-12 NOTE — Plan of Care (Signed)
  Problem: Clinical Measurements: Goal: Will remain free from infection Outcome: Progressing   Problem: Pain Managment: Goal: General experience of comfort will improve Outcome: Progressing   

## 2022-03-12 NOTE — Progress Notes (Signed)
Mobility Specialist - Progress Note ? ? 03/12/22 1500  ?Mobility  ?Activity Refused mobility  ? ? ? ?Pt declined mobility, no reason specified. Pt states "yall got to come after I get pain medicine". Per RN, pt was given pain meds ~45 minutes prior to attempt. Pt educated on importance of OOB activity post-procedure, but continues to decline and promises to ambulate tomorrow morning. Will attempt another date/time as pt is agreeable.  ? ? ?Kathee Delton ?Mobility Specialist ?03/12/22, 3:42 PM ? ? ? ? ?

## 2022-03-12 NOTE — Progress Notes (Addendum)
[facility] ?SURGICAL PROGRESS NOTE ? ?Hospital Day(s): 7.  ? ?Post op day(s): 4 Days Post-Op.  ? ?Interval History:  ?Patient seen and examined ?Patient reports he is feeling better this morning; able to make it through the day without pain medications  ?No chills, nausea, emesis ?His leukocytosis is significantly worse this morning; up to 24.5K ?Renal function normal; sCr - 0.65; UO - 2.3L ?Hypokalemia to 3.3; otherwise no significant electrolyte derangements  ?RUQ surgical drain with 45 ccs: serous ?RLQ surgical drain with 55 ccs; serous ?He is NPO aside from sips of ice and water ?Continues on Zosyn  ?He has passed flatus from colostomy ? ?Vital signs in last 24 hours: [min-max] current  ?Temp:  [98 ?F (36.7 ?C)-99.6 ?F (37.6 ?C)] 98 ?F (36.7 ?C) (03/27 0532) ?Pulse Rate:  [92-98] 92 (03/27 0532) ?Resp:  [18-20] 20 (03/27 0532) ?BP: (130-152)/(85-97) 152/97 (03/27 0532) ?SpO2:  [97 %-100 %] 97 % (03/27 0532)     Height: 6' (182.9 cm) Weight: 104.3 kg BMI (Calculated): 31.19  ? ?Intake/Output last 2 shifts:  ?03/26 0701 - 03/27 0700 ?In: 3165.9 [I.V.:3015.9; IV Piggyback:149.9] ?Out: 2425 [Urine:2325; Drains:100]  ? ?Physical Exam:  ?Constitutional: Alert, cooperative and no distress  ?Respiratory: Breathing non-labored at rest  ?Cardiovascular: regular rate and sinus rhythm  ?Gastrointestinal: Soft, expected incisional soreness, non-distended, no rebound/guarding. 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 Surgical drains in RUQ/RLQ both are serous.  ?Integumentary: Midline incision widely open with clean, moist, sub-cutaneous adipose, clean viable dermal perimeter, and no discharge ? ?Labs:  ? ?  Latest Ref Rng & Units 03/12/2022  ?  5:59 AM 03/10/2022  ?  6:00 AM 03/09/2022  ?  5:21 AM  ?CBC  ?WBC 4.0 - 10.5 K/uL 24.5   16.8   15.0    ?Hemoglobin 13.0 - 17.0 g/dL 9.3   8.7   10.6    ?Hematocrit 39.0 - 52.0 % 29.4   27.3   33.3     ?Platelets 150 - 400 K/uL 396   307   281    ? ? ?  Latest Ref Rng & Units 03/12/2022  ?  5:59 AM 03/11/2022  ?  3:50 AM 03/10/2022  ?  6:00 AM  ?CMP  ?Glucose 70 - 99 mg/dL 119   117   118    ?BUN 6 - 20 mg/dL '17   19   23    '$ ?Creatinine 0.61 - 1.24 mg/dL 0.65   0.74   0.66    ?Sodium 135 - 145 mmol/L 138   139   143    ?Potassium 3.5 - 5.1 mmol/L 3.3   3.6   3.8    ?Chloride 98 - 111 mmol/L 103   103   105    ?CO2 22 - 32 mmol/L 27   29   32    ?Calcium 8.9 - 10.3 mg/dL 7.9   7.9   7.6    ?Total Protein 6.5 - 8.1 g/dL 5.7    5.5    ?Total Bilirubin 0.3 - 1.2 mg/dL 2.9    2.0    ?Alkaline Phos 38 - 126 U/L 51    32    ?AST 15 - 41 U/L 51    50    ?ALT 0 - 44 U/L 51    30    ? ? ?Imaging studies:  ?No new pertinent imaging studies ? ? ?Assessment/Plan:  ?41 y.o. male  4 Days Post-Op s/p exploratory laparotomy, colectomy, colostomy, and appendectomy for perforated diverticulitis with feculent peritonitis.  ? ? - He has had significant evidence of bowel function return. As such, I think we can proceed with initiating CLD today + nutritional supplementation.  ? - Continue TPN at goal rate for now. Anticipate de-escalating this once advanced to and tolerating FLD ? - Monitor leukocytosis; worse this AM; may need to consider repeat imaging if this does not improve/develops fever  ? - Continue surgical drains; monitor and record output ? - Monitor abdominal examination ? - Pain medication prn; antiemetics prn ? - Engage WOC today for ostomy teaching ? - Mobilization encouraged; Plans to walk halls today. He declined PT engagement offer this morning  ? ?All of the above findings and recommendations were discussed with the patient, patient's family, and the medical team, and all of patient's and family's questions were answered to their expressed satisfaction. ? ?-- ?Edison Simon, PA-C ?[facility] ?03/12/2022, 7:52 AM ?[phone] ?M-F: 7am - 4pm ? ? ? ?

## 2022-03-12 NOTE — Progress Notes (Addendum)
PHARMACY - TOTAL PARENTERAL NUTRITION CONSULT NOTE  ? ?Indication: Prolonged ileus ? ?Patient Measurements: ?Height: 6' (182.9 cm) ?Weight: 104.3 kg (230 lb) ?IBW/kg (Calculated) : 77.6 ?TPN AdjBW (KG): 84.3 ?Body mass index is 31.19 kg/m?. ? ?Assessment: 41 y.o. male w/ no pertinent PMH s/p exploratory laparotomy, colectomy, colostomy, and appendectomy for perforated diverticulitis with feculent peritonitis.  ? ?Glucose / Insulin: BG <180, 1 u insulin past 24 hrs ?Electrolytes: K 3.3, Mg 1.7  ?Renal: SCr <1, stable ?Hepatic: AST/ALT mildly elevated ?Intake / Output; MIVF: dextrose 5 % in lactated ringers infusion at 25 mL/hr, net +9 L ?GI Imaging: ?KUB (03/09/2022)  ?IMPRESSION: ?1. Resolved small bowel obstruction. ?2. Postsurgical changes with new colostomy and surgical drains in ?the left upper and lower quadrants. ?3. Left pleural effusion. ? ?GI Surgeries / Procedures: s/p exploratory laparotomy, colectomy, colostomy, and appendectomy 03/08/22 ? ?Central access: 03/09/22 ?TPN start date: 03/09/22 ? ?Nutritional Goals: ?Goal TPN rate is 100 mL/hr (provides 139 g of protein and 2479 kcals per day) ? ?RD Assessment: ?Estimated Needs ?Total Energy Estimated Needs: 2400-2600 ?Total Protein Estimated Needs: 125-150 grams ?Total Fluid Estimated Needs: > 2 L ?Current Nutrition:  ?NPO ? ?Plan:  ?Continue TPN at goal rate of 124m/hr at 1800 (total volume including overfill 2400 mL) ?Nutritional components ?Amino acids (using 10% Travasol): 139.2 grams ?Dextrose: 336 grams ?Lipids (using 20% SMOFlipids): 66 grams ?kCal: 2359.2 ?Electrolytes in TPN: Na 566m/L, K 5043mL, Ca 5mE23m, Mg 5mEq89m and Phos 20mmo25m Cl:Ac 1:1 ?K 3.3, trending down - will order 10 mEq IV Kcl x 3 runs  ?Add standard MVI. T bili 2.9, however no mention of jaundice, continue standard trace elements in TPN ?Initiate Sensitive q6h SSI and adjust as needed  ?Continue MIVF at 25 mL/hr at 1800 ?Monitor TPN labs on Mon/Thurs, daily until  stable ? ?Taksh Hjort O Dmauri Rosenow ?03/12/2022,7:36 AM ? ?

## 2022-03-13 ENCOUNTER — Inpatient Hospital Stay: Payer: Commercial Managed Care - PPO

## 2022-03-13 LAB — AEROBIC/ANAEROBIC CULTURE W GRAM STAIN (SURGICAL/DEEP WOUND): Gram Stain: NONE SEEN

## 2022-03-13 LAB — BASIC METABOLIC PANEL
Anion gap: 9 (ref 5–15)
BUN: 14 mg/dL (ref 6–20)
CO2: 25 mmol/L (ref 22–32)
Calcium: 8.2 mg/dL — ABNORMAL LOW (ref 8.9–10.3)
Chloride: 102 mmol/L (ref 98–111)
Creatinine, Ser: 0.58 mg/dL — ABNORMAL LOW (ref 0.61–1.24)
GFR, Estimated: >60 mL/min (ref >60)
Glucose, Bld: 129 mg/dL — ABNORMAL HIGH (ref 70–99)
Potassium: 4.1 mmol/L (ref 3.5–5.1)
Sodium: 136 mmol/L (ref 135–145)

## 2022-03-13 LAB — GLUCOSE, CAPILLARY
Glucose-Capillary: 124 mg/dL — ABNORMAL HIGH (ref 70–99)
Glucose-Capillary: >600 mg/dL (ref 70–99)
Glucose-Capillary: 153 mg/dL — ABNORMAL HIGH (ref 70–99)
Glucose-Capillary: 140 mg/dL — ABNORMAL HIGH (ref 70–99)
Glucose-Capillary: 144 mg/dL — ABNORMAL HIGH (ref 70–99)

## 2022-03-13 LAB — CBC
HCT: 29.8 % — ABNORMAL LOW (ref 39.0–52.0)
Hemoglobin: 9.7 g/dL — ABNORMAL LOW (ref 13.0–17.0)
MCH: 30.1 pg (ref 26.0–34.0)
MCHC: 32.6 g/dL (ref 30.0–36.0)
MCV: 92.5 fL (ref 80.0–100.0)
Platelets: 466 10*3/uL — ABNORMAL HIGH (ref 150–400)
RBC: 3.22 MIL/uL — ABNORMAL LOW (ref 4.22–5.81)
RDW: 12.8 % (ref 11.5–15.5)
WBC: 25.9 10*3/uL — ABNORMAL HIGH (ref 4.0–10.5)
nRBC: 0.1 % (ref 0.0–0.2)

## 2022-03-13 LAB — MAGNESIUM: Magnesium: 1.9 mg/dL (ref 1.7–2.4)

## 2022-03-13 LAB — PHOSPHORUS: Phosphorus: 3.4 mg/dL (ref 2.5–4.6)

## 2022-03-13 MED ORDER — TRAVASOL 10 % IV SOLN
INTRAVENOUS | Status: DC
  Filled 2022-03-13: qty 1392

## 2022-03-13 MED ORDER — IOHEXOL 9 MG/ML PO SOLN
500.0000 mL | ORAL | Status: AC
  Administered 2022-03-13 (×2): 500 mL via ORAL

## 2022-03-13 MED ORDER — ADULT MULTIVITAMIN W/MINERALS CH
1.0000 | ORAL_TABLET | Freq: Every day | ORAL | Status: DC
  Administered 2022-03-14 – 2022-03-16 (×3): 1 via ORAL
  Filled 2022-03-13 (×3): qty 1

## 2022-03-13 MED ORDER — ENSURE ENLIVE PO LIQD
237.0000 mL | Freq: Two times a day (BID) | ORAL | Status: DC
  Administered 2022-03-13 – 2022-03-14 (×3): 237 mL via ORAL

## 2022-03-13 MED ORDER — IOHEXOL 300 MG/ML  SOLN
100.0000 mL | Freq: Once | INTRAMUSCULAR | Status: AC | PRN
  Administered 2022-03-13: 100 mL via INTRAVENOUS

## 2022-03-13 MED ORDER — PIPERACILLIN-TAZOBACTAM 3.375 G IVPB
3.3750 g | Freq: Three times a day (TID) | INTRAVENOUS | Status: DC
  Administered 2022-03-13 – 2022-03-16 (×10): 3.375 g via INTRAVENOUS
  Filled 2022-03-13 (×10): qty 50

## 2022-03-13 NOTE — Progress Notes (Signed)
Nutrition Follow-up ? ?DOCUMENTATION CODES:  ? ?Obesity unspecified ? ?INTERVENTION:  ? ?-D/c Boost Breeze po TID, each supplement provides 250 kcal and 9 grams of protein  ?-Ensure Enlive po BID, each supplement provides 350 kcal and 20 grams of protein.  ?-TPN management per pharmacy ?-MVI with minerals daily to start 03/14/22; spoke with pharmacist- to remove MVI from TPN tomorrow ? ?NUTRITION DIAGNOSIS:  ? ?Increased nutrient needs related to post-op healing as evidenced by estimated needs. ? ?Ongoing ? ?GOAL:  ? ?Patient will meet greater than or equal to 90% of their needs ? ?Met with TPN ? ?MONITOR:  ? ?Labs, Weight trends, Skin, I & O's ? ?REASON FOR ASSESSMENT:  ? ?Consult ?New TPN/TNA ? ?ASSESSMENT:  ? ?[facility] is a 41 y.o. male presenting with a 3 day history of generalized cramping abdominal pain.  The patient noticed some mild pain in the left lower quadrant starting the night of 03/02/22.  He went to work and thought that he might be constipated.  He has had some issues with constipation in the past and taking a laxative has helped.  He had liquid bowel movements but his pain did not resolve.  On 3/18, the pain remained similar, but on the night of 3/19, his pain became worse.  He went to work again, but ended up coming to the ER due to worsening pain.  Denies any fevers but reports feeling chills.  Denies any nausea or vomiting, chest pain, shortness of breath.  In the ER, his workup showed a WBC of 17.4.  He had a CT scan of abdomen/pelvis which showed perforated sigmoid diverticulitis, with inflammatory changes of the sigmoid colon with a few bubbles of gas in pericolonic region.  No diffuse free air, no abscess.  I have personally viewed the images.  The patient denies any prior episodes. ? ?3/23- s/p PROCEDURES: ?1. Drainage of intra-abdominal abscess interloop ?2. Reduction of internal Hernia ?3. 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 Splenic  Flexure takedown ?5. Appendectomy ?6. Partial omentectomy ?3/24- pulled NGT overnight and refused replacement ?3/27- advanced to clear liquid diet ?3/28- advanced to full liquid diet ? ?Reviewed I/O's: -936 ml x 24 hours and +8.1 L since admission ? ?UOP: 2.5 L x 24 hours ? ?Colostomy output: 50 ml x 24 hours  ? ?Case discussed with pharmacist; plan to start weaning TPN tomorrow if tolerating full liquid diet. Plan to remove MVI form TPN tomorrow and add orally since pt is taking PO.  ? ?Pt continues to receiving TPN- currently at goal rate of 100 ml/hr, which provides 2359 kcals and 139 grams protein, which meets 100% of estimated kcal and protein needs.  ? ?Pt sleeping soundly at time of visit. Noted two containers of Boost Breeze at bedside. No meal completion data available to assess at this time. RD will upgrade supplement to Ensure secondary to diet advancement.  ? ?Labs reviewed: CBGS: 124-600 (inpatient orders for glycemic control are 0-9 units insulin aspart every 6 hours).   ? ?Diet Order:   ?Diet Order   ? ?       ?  Diet full liquid Room service appropriate? Yes; Fluid consistency: Thin  Diet effective now       ?  ? ?  ?  ? ?  ? ? ?EDUCATION NEEDS:  ? ?Education needs have been addressed ? ?Skin:  Skin Assessment: Skin Integrity Issues: ?Skin Integrity Issues:: Incisions ?Incisions: closed abdomen ? ?Last  BM:  03/07/22 ? ?Height:  ? ?Ht Readings from Last 1 Encounters:  ?03/04/22 6' (1.829 m)  ? ? ?Weight:  ? ?Wt Readings from Last 1 Encounters:  ?03/13/22 122.8 kg  ? ? ?Ideal Body Weight:  80.9 kg ? ?BMI:  Body mass index is 36.72 kg/m?. ? ?Estimated Nutritional Needs:  ? ?Kcal:  2400-2600 ? ?Protein:  125-150 grams ? ?Fluid:  > 2 L ? ? ? ?Loistine Chance, RD, LDN, CDCES ?Registered Dietitian II ?Certified Diabetes Care and Education Specialist ?Please refer to [facility] for RD and/or RD on-call/weekend/after hours pager  ?

## 2022-03-13 NOTE — Progress Notes (Addendum)
Mobility Specialist - Progress Note ? ? 03/13/22 1100  ?Mobility  ?Activity Refused mobility  ? ? ? ?Pt declined mobility, no reason specified. Pt reports ambulating this morning; RN confirmed that pt had been sitting in recliner this date. Pt uninterested in further activity at this time. Will attempt session another date/time.  ? ? ?Kathee Delton ?Mobility Specialist ?03/13/22, 11:15 AM ? ? ? ? ?

## 2022-03-13 NOTE — Plan of Care (Signed)
  Problem: Clinical Measurements: Goal: Will remain free from infection Outcome: Not Progressing   Problem: Nutrition: Goal: Adequate nutrition will be maintained Outcome: Not Progressing   Problem: Elimination: Goal: Will not experience complications related to bowel motility Outcome: Not Progressing   Problem: Pain Managment: Goal: General experience of comfort will improve Outcome: Not Progressing   

## 2022-03-13 NOTE — Progress Notes (Signed)
At 0541, attempted to get blood glucose through picc line together with blood extraction, result came high, rechecked through finger stick , result at 124 mg /dl ?

## 2022-03-13 NOTE — Progress Notes (Signed)
PHARMACY - TOTAL PARENTERAL NUTRITION CONSULT NOTE  ? ?Indication: Prolonged ileus ? ?Patient Measurements: ?Height: 6' (182.9 cm) ?Weight: 122.8 kg (270 lb 11.6 oz) ?IBW/kg (Calculated) : 77.6 ?TPN AdjBW (KG): 84.3 ?Body mass index is 36.72 kg/m?. ? ?Assessment: 41 y.o. male w/ no pertinent PMH s/p exploratory laparotomy, colectomy, colostomy, and appendectomy for perforated diverticulitis with feculent peritonitis.  ? ?Glucose / Insulin: BG <180, 2 u insulin past 24 hrs ?Electrolytes: WNL ?Renal: SCr <1, stable ?Hepatic: AST/ALT mildly elevated ?Intake / Output; MIVF: dextrose 5 % in lactated ringers infusion at 25 mL/hr, net + 8.6 L ?GI Imaging: ?KUB (03/09/2022)  ?IMPRESSION: ?1. Resolved small bowel obstruction. ?2. Postsurgical changes with new colostomy and surgical drains in ?the left upper and lower quadrants. ?3. Left pleural effusion. ? ?GI Surgeries / Procedures: s/p exploratory laparotomy, colectomy, colostomy, and appendectomy 03/08/22 ? ?Central access: 03/09/22 ?TPN start date: 03/09/22 ? ?Nutritional Goals: ?Goal TPN rate is 100 mL/hr (provides 139 g of protein and 2479 kcals per day) ? ?RD Assessment: ?Estimated Needs ?Total Energy Estimated Needs: 2400-2600 ?Total Protein Estimated Needs: 125-150 grams ?Total Fluid Estimated Needs: > 2 L ?Current Nutrition:  ?NPO ? ?Plan:  ?Continue TPN at goal rate of 145m/hr at 1800 (total volume including overfill 2400 mL) ?Nutritional components ?Amino acids (using 10% Travasol): 139.2 grams ?Dextrose: 336 grams ?Lipids (using 20% SMOFlipids): 66 grams ?kCal: 2359.2 ?Electrolytes in TPN: Na 579m/L, K 5088mL, Ca 5mE56m, Mg 5mEq38m and Phos 20mmo6m Cl:Ac 1:1 ?Add standard MVI. T bili 2.9, however no mention of jaundice, continue standard trace elements in TPN ?Initiate Sensitive q6h SSI and adjust as needed  ?Continue MIVF at 25 mL/hr at 1800 ?Monitor TPN labs on Mon/Thurs, daily until stable ? ?Celines Femia O Amesha Bailey ?03/13/2022,7:40 AM ? ?

## 2022-03-13 NOTE — Progress Notes (Addendum)
[facility] ?SURGICAL PROGRESS NOTE ? ?Hospital Day(s): 8.  ? ?Post op day(s): 5 Days Post-Op.  ? ?Interval History:  ?Patient seen and examined ?Patient reports he is doing fairly well; still with expected abdominal discomfort ?No chills, nausea, emesis ?His leukocytosis has worsened again this morning; up to 25.9K ?Hgb stable at 9.7 ?Renal function normal; scr - 0.58; UO - 2.5L ?No significant electrolyte derangements  ?RUQ surgical drain with 20 ccs: serous ?RLQ surgical drain with 20 ccs; serous ?He is on CLD; tolerating well ?Continues on Zosyn  ?He has passed flatus/stool from colostomy ? ?Vital signs in last 24 hours: [min-max] current  ?Temp:  [98.1 ?F (36.7 ?C)-99 ?F (37.2 ?C)] 98.5 ?F (36.9 ?C) (03/28 0755) ?Pulse Rate:  [81-97] 88 (03/28 0755) ?Resp:  [18-20] 20 (03/28 0755) ?BP: (148-163)/(91-99) 154/93 (03/28 0755) ?SpO2:  [97 %-100 %] 98 % (03/28 0755) ?Weight:  [122.8 kg] 122.8 kg (03/28 0500)     Height: 6' (182.9 cm) Weight: 122.8 kg BMI (Calculated): 36.71  ? ?Intake/Output last 2 shifts:  ?03/27 0701 - 03/28 0700 ?In: 1654.3 [I.V.:1654.3] ?Out: 2590 [Urine:2500; Drains:40; Stool:50]  ? ?Physical Exam:  ?Constitutional: Alert, cooperative and no distress  ?Respiratory: Breathing non-labored at rest  ?Cardiovascular: regular rate and sinus rhythm  ?Gastrointestinal: Soft, expected incisional soreness, non-distended, no rebound/guarding. 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 Surgical drains in RUQ/RLQ both are serous.  ?Integumentary: Midline incision widely open with clean, moist, sub-cutaneous adipose, clean viable dermal perimeter, and no discharge ? ?Labs:  ? ?  Latest Ref Rng & Units 03/12/2022  ?  5:59 AM 03/10/2022  ?  6:00 AM 03/09/2022  ?  5:21 AM  ?CBC  ?WBC 4.0 - 10.5 K/uL 24.5   16.8   15.0    ?Hemoglobin 13.0 - 17.0 g/dL 9.3   8.7   10.6    ?Hematocrit 39.0 - 52.0 % 29.4   27.3   33.3    ?Platelets 150 -  400 K/uL 396   307   281    ? ? ?  Latest Ref Rng & Units 03/12/2022  ?  5:59 AM 03/11/2022  ?  3:50 AM 03/10/2022  ?  6:00 AM  ?CMP  ?Glucose 70 - 99 mg/dL 119   117   118    ?BUN 6 - 20 mg/dL '17   19   23    '$ ?Creatinine 0.61 - 1.24 mg/dL 0.65   0.74   0.66    ?Sodium 135 - 145 mmol/L 138   139   143    ?Potassium 3.5 - 5.1 mmol/L 3.3   3.6   3.8    ?Chloride 98 - 111 mmol/L 103   103   105    ?CO2 22 - 32 mmol/L 27   29   32    ?Calcium 8.9 - 10.3 mg/dL 7.9   7.9   7.6    ?Total Protein 6.5 - 8.1 g/dL 5.7    5.5    ?Total Bilirubin 0.3 - 1.2 mg/dL 2.9    2.0    ?Alkaline Phos 38 - 126 U/L 51    32    ?AST 15 - 41 U/L 51    50    ?ALT 0 - 44 U/L 51    30    ? ? ?Imaging studies:  ?No new pertinent imaging studies ? ? ?Assessment/Plan:  ?41 y.o. male 5 Days Post-Op s/p  exploratory laparotomy, colectomy, colostomy, and appendectomy for perforated diverticulitis with feculent peritonitis.  ? ? - Will advance to full liquids this morning.  ? - Continue TPN at goal rate for now. Anticipate de-escalating tomorrow if tolerating FLD today ? - Monitor leukocytosis; worse this AM; likely needs repeat CT to ensure no missed intra-abdominal process  ? - Continue surgical drains; monitor and record output ? - Monitor abdominal examination ? - Pain medication prn; antiemetics prn ? - WOC following ? - Mobilization encouraged; will engage PT to ensure no at home needs  ? ?All of the above findings and recommendations were discussed with the patient, patient's family, and the medical team, and all of patient's and family's questions were answered to their expressed satisfaction. ? ?-- ?Edison Simon, PA-C ?[facility] ?03/13/2022, 8:57 AM ?[phone] ?M-F: 7am - 4pm ? ?

## 2022-03-14 LAB — GLUCOSE, CAPILLARY
Glucose-Capillary: 102 mg/dL — ABNORMAL HIGH (ref 70–99)
Glucose-Capillary: 122 mg/dL — ABNORMAL HIGH (ref 70–99)
Glucose-Capillary: 122 mg/dL — ABNORMAL HIGH (ref 70–99)
Glucose-Capillary: 125 mg/dL — ABNORMAL HIGH (ref 70–99)

## 2022-03-14 LAB — CBC
HCT: 29.3 % — ABNORMAL LOW (ref 39.0–52.0)
Hemoglobin: 9.3 g/dL — ABNORMAL LOW (ref 13.0–17.0)
MCH: 29.7 pg (ref 26.0–34.0)
MCHC: 31.7 g/dL (ref 30.0–36.0)
MCV: 93.6 fL (ref 80.0–100.0)
Platelets: 504 10*3/uL — ABNORMAL HIGH (ref 150–400)
RBC: 3.13 MIL/uL — ABNORMAL LOW (ref 4.22–5.81)
RDW: 13 % (ref 11.5–15.5)
WBC: 23.8 10*3/uL — ABNORMAL HIGH (ref 4.0–10.5)
nRBC: 0 % (ref 0.0–0.2)

## 2022-03-14 MED ORDER — TRAVASOL 10 % IV SOLN: INTRAVENOUS | Status: AC

## 2022-03-14 MED ORDER — TRAVASOL 10 % IV SOLN
INTRAVENOUS | Status: AC
  Filled 2022-03-14: qty 696

## 2022-03-14 NOTE — Progress Notes (Addendum)
[facility] ?SURGICAL PROGRESS NOTE ? ?Hospital Day(s): 9.  ? ?Post op day(s): 6 Days Post-Op.  ? ?Interval History:  ?Patient seen and examined ?Patient reports he is doing well; not much appetite ?Abdominal pain controlled with medications ?No fever, chills, nausea, emesis ?RUQ surgical drain with 15 ccs: serous ?RLQ surgical drain with 60 ccs; serous ?He is on FLD; tolerating well ?Continues on Zosyn  ?He has passed flatus/stool from colostomy ?CT Abdomen/Pelvis (03/28) was without undrained abscess ? ?Vital signs in last 24 hours: [min-max] current  ?Temp:  [97.8 ?F (36.6 ?C)-98.9 ?F (37.2 ?C)] 98.9 ?F (37.2 ?C) (03/29 0445) ?Pulse Rate:  [87-93] 93 (03/29 0445) ?Resp:  [16-20] 18 (03/29 0445) ?BP: (131-154)/(86-95) 150/95 (03/29 0445) ?SpO2:  [97 %-98 %] 97 % (03/29 0445)     Height: 6' (182.9 cm) Weight: 122.8 kg BMI (Calculated): 36.71  ? ?Intake/Output last 2 shifts:  ?03/28 0701 - 03/29 0700 ?In: 2891.7 [I.V.:2755.4; IV Piggyback:136.3] ?Out: 2536 [Urine:3450; Drains:75; Stool:50]  ? ?Physical Exam:  ?Constitutional: Alert, cooperative and no distress  ?Respiratory: Breathing non-labored at rest  ?Cardiovascular: regular rate and sinus rhythm  ?Gastrointestinal: Soft, expected incisional soreness, non-distended, no rebound/guarding. 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 Surgical drains in RUQ which is serous, RLQ drain more serosanguinous  ?Integumentary: Midline incision widely open with clean, moist, sub-cutaneous adipose, clean viable dermal perimeter, and no discharge ? ?Labs:  ? ?  Latest Ref Rng & Units 03/13/2022  ?  8:55 AM 03/12/2022  ?  5:59 AM 03/10/2022  ?  6:00 AM  ?CBC  ?WBC 4.0 - 10.5 K/uL 25.9   24.5   16.8    ?Hemoglobin 13.0 - 17.0 g/dL 9.7   9.3   8.7    ?Hematocrit 39.0 - 52.0 % 29.8   29.4   27.3    ?Platelets 150 - 400 K/uL 466   396   307    ? ? ?  Latest Ref Rng & Units 03/13/2022  ?  8:28 AM 03/12/2022   ?  5:59 AM 03/11/2022  ?  3:50 AM  ?CMP  ?Glucose 70 - 99 mg/dL 129   119   117    ?BUN 6 - 20 mg/dL '14   17   19    '$ ?Creatinine 0.61 - 1.24 mg/dL 0.58   0.65   0.74    ?Sodium 135 - 145 mmol/L 136   138   139    ?Potassium 3.5 - 5.1 mmol/L 4.1   3.3   3.6    ?Chloride 98 - 111 mmol/L 102   103   103    ?CO2 22 - 32 mmol/L '25   27   29    '$ ?Calcium 8.9 - 10.3 mg/dL 8.2   7.9   7.9    ?Total Protein 6.5 - 8.1 g/dL  5.7     ?Total Bilirubin 0.3 - 1.2 mg/dL  2.9     ?Alkaline Phos 38 - 126 U/L  51     ?AST 15 - 41 U/L  51     ?ALT 0 - 44 U/L  51     ? ? ?Imaging studies:  ?No new pertinent imaging studies ? ? ?Assessment/Plan:  ?41 y.o. male 6 Days Post-Op s/p exploratory laparotomy, colectomy, colostomy, and appendectomy for perforated diverticulitis with feculent peritonitis.  ? ? - Advance to soft diet ? - Wean TPN today to half rate; likely stop  this tomorrow ? - Monitor leukocytosis; improved today; CT was without intra-abdominal process ? - Continue surgical drains; monitor and record output; may be able to DC RUQ drain before discharge  ? - Midline wound care: Continue wet to dry dressing changes daily; ? Transition to wound vac if obtainable for home - will discuss with CSW ? - Monitor abdominal examination ? - Pain medication prn; antiemetics prn ? - WOC following ? - Mobilization encouraged; will engage PT to ensure no at home needs  ? ? - Discharge Planning: Diet advancing and weaning from TPN. WBC improved today and CT was reassuring. I have reached out to [place] this morning to investigate options for home health planning for wound care, PT, etc. Anticipate he will likely be ready for discharge tomorrow vs Friday.  ? ?All of the above findings and recommendations were discussed with the patient, patient's family, and the medical team, and all of patient's and family's questions were answered to their expressed satisfaction. ? ?-- ?Edison Simon, PA-C ?[facility] ?03/14/2022, 7:26  AM ?[phone] ?M-F: 7am - 4pm ? ?

## 2022-03-14 NOTE — TOC Initial Note (Addendum)
Transition of Care (TOC) - Initial/Assessment Note  ? ? ?Patient Details  ?Name: Erik Johns ?MRN: [id number] ?Date of Birth: 09-Jul-1981 ? ?Transition of Care (TOC) CM/SW Contact:    ?Candie Chroman, LCSW ?Phone Number: ?03/14/2022, 1:56 PM ? ?Clinical Narrative:  Surgical PA asked about potential home health for this patient. May need wound vac at discharge. Told him may be difficult to find home health due to commercial insurance plan and high copays per visit. [facility] reviewing. [facility] would need a Medicare patient to pair it with. [place], [facility], [facility], [facility], and [facility] are unable to accept. Left messages for [facility] and [facility].         ? ?3:01 pm: [facility], and [facility] are unable to accept referral. Faxed referral to [facility].     ? ?3:55 pm: [facility] and [facility] unable to accept referral.    ? ?4:04 pm: [facility] and [facility] are unable to accept referral. ? ?4:22 pm: Called patient to provide update. He confirmed he does not have a PCP and is agreeable to appointment at [place] calling but unable to speak with anyone so will try again tomorrow. PT recommending outpatient PT but patient will need a PCP for this so he will notify new PCP once established. He is agreeable to RW. Sent secure chat to surgical PA requesting DME order. ? ?Expected Discharge Plan: [place] ?Barriers to Discharge: Continued Medical Work up ? ? ?Patient Goals and CMS Choice ?  ?  ?  ? ?Expected Discharge Plan and Services ?Expected Discharge Plan: [place] ?  ?  ?  ?Living arrangements for the past 2 months: [place] ?                ?  ?  ?  ?  ?  ?  ?  ?  ?  ?  ? ?Prior Living Arrangements/Services ?Living arrangements for the past 2 months: [place] ?  ?Patient language and need for interpreter reviewed:: Yes ?       ?Need for Family Participation in Patient Care: Yes (Comment) ?  ?  ?Criminal Activity/Legal  Involvement Pertinent to Current Situation/Hospitalization: No - Comment as needed ? ?Activities of Daily Living ?Home Assistive Devices/Equipment: None ?ADL Screening (condition at time of admission) ?Patient's cognitive ability adequate to safely complete daily activities?: Yes ?Is the patient deaf or have difficulty hearing?: No ?Does the patient have difficulty seeing, even when wearing glasses/contacts?: No ?Does the patient have difficulty concentrating, remembering, or making decisions?: No ?Patient able to express need for assistance with ADLs?: Yes ?Does the patient have difficulty dressing or bathing?: No ?Independently performs ADLs?: Yes (appropriate for developmental age) ?Does the patient have difficulty walking or climbing stairs?: No ?Weakness of Legs: None ?Weakness of Arms/Hands: None ? ?Permission Sought/Granted ?  ?  ?   ?   ?   ?   ? ?Emotional Assessment ?Appearance:: Appears stated age ?  ?  ?Orientation: : Oriented to Self, Oriented to Place, Oriented to  Time, Oriented to Situation ?Alcohol / Substance Use: Not Applicable ?Psych Involvement: No (comment) ? ?Admission diagnosis:  Generalized abdominal pain [R10.84] ?Diverticulitis of colon with perforation [K57.20] ?Diverticulitis of large intestine with perforation [K57.20] ?Patient Active Problem List  ? Diagnosis Date Noted  ? Diverticulitis of colon with perforation 03/05/2022  ? ?PCP:  Pcp, No ?Pharmacy:  No Pharmacies Listed ? ? ? ?Social  Determinants of Health (SDOH) Interventions ?  ? ?Readmission Risk Interventions ?   ? View : No data to display.  ?  ?  ?  ? ? ? ?

## 2022-03-14 NOTE — Evaluation (Signed)
Physical Therapy Evaluation ?Patient Details ?Name: Erik Johns ?MRN: [id number] ?DOB: 07-Jul-1981 ?Today's Date: 03/14/2022 ? ?History of Present Illness ? 41 yo male with onset of crampy abd pain for three days was admitted 3/19, now has been found to have perforated sigmoid diverticulitis.  Received EXPLORATORY LAPAROTOMY COLECTOMY WITH COLOSTOMY CREATION/HARTMANN PROCEDURE  APPENDECTOMY.  PMHx:  none pertinent  ?Clinical Impression ? Pt was seen for recovery of his mobility after being off his feet for several days post colectomy, colostomy, appendectomy.  He is motivated and reported he had been working on some mobility before PT arrived.  Talked with him about how to properly strengthen calves, and recommended outpatient therapy.  Pt will likely decline it but asking for the outpatient order to have a controlled instruction of strengthening to BLE's given the new abd weakness with his procedures.  Follow acutely for goals of PT.   ?   ? ?Recommendations for follow up therapy are one component of a multi-disciplinary discharge planning process, led by the attending physician.  Recommendations may be updated based on patient status, additional functional criteria and insurance authorization. ? ?Follow Up Recommendations Outpatient PT ? ?  ?Assistance Recommended at Discharge Set up Supervision/Assistance  ?Patient can return home with the following ? Assist for transportation;Assistance with cooking/housework;A little help with bathing/dressing/bathroom ? ?  ?Equipment Recommendations Rolling walker (2 wheels)  ?Recommendations for Other Services ?    ?  ?Functional Status Assessment Patient has had a recent decline in their functional status and demonstrates the ability to make significant improvements in function in a reasonable and predictable amount of time.  ? ?  ?Precautions / Restrictions Precautions ?Precautions: Fall ?Precaution Comments: monitor need for AD ?Restrictions ?Weight Bearing Restrictions: No   ? ?  ? ?Mobility ? Bed Mobility ?Overal bed mobility: Modified Independent ?  ?  ?  ?  ?  ?  ?General bed mobility comments: using bed rail but assisting to side of bed alone ?  ? ?Transfers ?Overall transfer level: Modified independent ?Equipment used: None ?  ?  ?  ?  ?  ?  ?  ?  ?  ? ?Ambulation/Gait ?Ambulation/Gait assistance: Min guard (for safety) ?Gait Distance (Feet): 75 Feet ?Assistive device: Rolling walker (2 wheels), IV Pole ?Gait Pattern/deviations: Step-through pattern, Decreased stride length, Trunk flexed ?Gait velocity: reduced ?Gait velocity interpretation: <1.31 ft/sec, indicative of household ambulator ?Pre-gait activities: standing balance ck ?General Gait Details: pt walks with IV pole or no AD at times, but with RW is more upright and faster ? ?Stairs ?  ?  ?  ?  ?  ? ?Wheelchair Mobility ?  ? ?Modified Rankin (Stroke Patients Only) ?  ? ?  ? ?Balance Overall balance assessment: Needs assistance ?Sitting-balance support: Feet supported ?Sitting balance-Leahy Scale: Good ?  ?  ?Standing balance support: Single extremity supported, Bilateral upper extremity supported ?Standing balance-Leahy Scale: Fair ?  ?  ?  ?  ?  ?  ?  ?  ?  ?  ?  ?  ?   ? ? ? ?Pertinent Vitals/Pain Pain Assessment ?Pain Assessment: Faces ?Faces Pain Scale: Hurts a little bit ?Pain Location: abdomen ?Pain Descriptors / Indicators: Operative site guarding ?Pain Intervention(s): Monitored during session, Repositioned  ? ? ?Home Living Family/patient expects to be discharged to:: Private residence ?Living Arrangements: Other relatives ?Available Help at Discharge: Family;Available PRN/intermittently ?Type of Home: House ?Home Access: Level entry ?  ?  ?  ?Home Layout: One level ?Home  Equipment: None ?Additional Comments: has been working and independent  ?  ?Prior Function Prior Level of Function : Independent/Modified Independent;Driving;Working/employed ?  ?  ?  ?  ?  ?  ?Mobility Comments: no assistive device needed ?  ?   ? ? ?Hand Dominance  ? Dominant Hand: Right ? ?  ?Extremity/Trunk Assessment  ? Upper Extremity Assessment ?Upper Extremity Assessment: Overall WFL for tasks assessed ?  ? ?Lower Extremity Assessment ?Lower Extremity Assessment: Generalized weakness ?  ? ?Cervical / Trunk Assessment ?Cervical / Trunk Assessment: Normal  ?Communication  ? Communication: No difficulties  ?Cognition Arousal/Alertness: Awake/alert ?Behavior During Therapy: [facility] for tasks assessed/performed ?Overall Cognitive Status: Within Functional Limits for tasks assessed ?  ?  ?  ?  ?  ?  ?  ?  ?  ?  ?  ?  ?  ?  ?  ?  ?  ?  ?  ? ?  ?General Comments General comments (skin integrity, edema, etc.): pt is more posturally supported and in less effortful mode with a RW ? ?  ?Exercises General Exercises - Lower Extremity ?Toe Raises: Strengthening, 10 reps  ? ?Assessment/Plan  ?  ?PT Assessment Patient needs continued PT services  ?PT Problem List Decreased strength;Decreased activity tolerance;Decreased knowledge of use of DME;Pain;Decreased skin integrity ? ?   ?  ?PT Treatment Interventions DME instruction;Gait training;Functional mobility training;Therapeutic activities;Therapeutic exercise;Balance training;Neuromuscular re-education;Patient/family education   ? ?PT Goals (Current goals can be found in the Care Plan section)  ?Acute Rehab PT Goals ?Patient Stated Goal: to get home and walk/strengthen his legs ?PT Goal Formulation: With patient ?Time For Goal Achievement: 03/21/22 ?Potential to Achieve Goals: Good ? ?  ?Frequency Min 2X/week ?  ? ? ?Co-evaluation   ?  ?  ?  ?  ? ? ?  ?AM-PAC PT "6 Clicks" Mobility  ?Outcome Measure Help needed turning from your back to your side while in a flat bed without using bedrails?: None ?Help needed moving from lying on your back to sitting on the side of a flat bed without using bedrails?: A Little ?Help needed moving to and from a bed to a chair (including a wheelchair)?: A Little ?Help needed standing up  from a chair using your arms (e.g., wheelchair or bedside chair)?: A Little ?Help needed to walk in hospital room?: A Little ?Help needed climbing 3-5 steps with a railing? : A Little ?6 Click Score: 19 ? ?  ?End of Session   ?Activity Tolerance: Patient tolerated treatment well;Other (comment) (mm fatigue) ?Patient left: in bed;with call bell/phone within reach (had been up all AM in chair) ?Nurse Communication: Mobility status ?PT Visit Diagnosis: Muscle weakness (generalized) (M62.81);Pain ?Pain - Right/Left:  (abd) ?Pain - part of body:  (abd) ?  ? ?Time: 8250-5397 ?PT Time Calculation (min) (ACUTE ONLY): 26 min ? ? ?Charges:   PT Evaluation ?$PT Eval Moderate Complexity: 1 Mod ?PT Treatments ?$Gait Training: 8-22 mins ?  ?   ? ?Ramond Dial ?03/14/2022, 12:09 PM ? ?Mee Hives, PT PhD ?Acute Rehab Dept. Number: [facility] [phone] and [place] [phone] ? ? ?

## 2022-03-14 NOTE — Progress Notes (Signed)
PHARMACY - TOTAL PARENTERAL NUTRITION CONSULT NOTE  ? ?Indication: Prolonged ileus ? ?Patient Measurements: ?Height: 6' (182.9 cm) ?Weight: 122.8 kg (270 lb 11.6 oz) ?IBW/kg (Calculated) : 77.6 ?TPN AdjBW (KG): 84.3 ?Body mass index is 36.72 kg/m?. ? ?Assessment: 41 y.o. male w/ no pertinent PMH s/p exploratory laparotomy, colectomy, colostomy, and appendectomy for perforated diverticulitis with feculent peritonitis.  ? ?Glucose / Insulin: BG <180, 1 u insulin past 24 hrs ?Electrolytes: WNL ?Renal: SCr <1, stable ?Hepatic: AST/ALT mildly elevated ?Intake / Output; MIVF: MIVF discontinued 3/29; net + 7.5 L ?GI Imaging: ?KUB (03/09/2022) ?1. Resolved small bowel obstruction. ?2. Postsurgical changes with new colostomy and surgical drains in ?the left upper and lower quadrants. ?3. Left pleural effusion. ? ?CT (03/13/22) ?Status post Hartmann's procedure for perforate diverticulitis. 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 Probable mild ileus pattern of the small bowel. ? ?GI Surgeries / Procedures: s/p exploratory laparotomy, colectomy, colostomy, and appendectomy 03/08/22 ? ?Central access: 03/09/22 ?TPN start date: 03/09/22 ? ?Nutritional Goals: ?Goal TPN rate is 100 mL/hr (provides 139 g of protein and 2479 kcals per day) ? ?RD Assessment: ?Estimated Needs ?Total Energy Estimated Needs: 2400-2600 ?Total Protein Estimated Needs: 125-150 grams ?Total Fluid Estimated Needs: > 2 L ?Current Nutrition:  ?NPO ? ?Plan:  ?Wean TPN to half rate of 67m/hr at 1800 (total volume including overfill 2400 mL) ?Electrolytes in TPN: Na 518m/L, K 5039mL, Ca 5mE27m, Mg 5mEq27m and Phos 20mmo15m Cl:Ac 1:1 ?T bili 2.9, however no mention of jaundice, continue standard trace elements in TPN ?Pt started on PO multivitamin. Will remove from TPN ?Initiate Sensitive q6h SSI and adjust as needed  ?Monitor TPN labs on Mon/Thurs, daily until stable ? ?Zenobia Kuennen O Abram Sax ?03/14/2022,7:56 AM ? ?

## 2022-03-14 NOTE — Plan of Care (Signed)
?  Problem: Clinical Measurements: ?Goal: Will remain free from infection ?Outcome: Not Progressing ?  ?Problem: Clinical Measurements: ?Goal: Diagnostic test results will improve ?Outcome: Not Progressing ?  ?Problem: Activity: ?Goal: Risk for activity intolerance will decrease ?Outcome: Not Progressing ?  ?Problem: Nutrition: ?Goal: Adequate nutrition will be maintained ?Outcome: Not Progressing ?  ?Problem: Elimination: ?Goal: Will not experience complications related to bowel motility ?Outcome: Not Progressing ?  ?

## 2022-03-15 LAB — CBC
HCT: 29.3 % — ABNORMAL LOW (ref 39.0–52.0)
Hemoglobin: 9.4 g/dL — ABNORMAL LOW (ref 13.0–17.0)
MCH: 29.7 pg (ref 26.0–34.0)
MCHC: 32.1 g/dL (ref 30.0–36.0)
MCV: 92.4 fL (ref 80.0–100.0)
Platelets: 537 10*3/uL — ABNORMAL HIGH (ref 150–400)
RBC: 3.17 MIL/uL — ABNORMAL LOW (ref 4.22–5.81)
RDW: 13 % (ref 11.5–15.5)
WBC: 20.6 10*3/uL — ABNORMAL HIGH (ref 4.0–10.5)
nRBC: 0 % (ref 0.0–0.2)

## 2022-03-15 LAB — COMPREHENSIVE METABOLIC PANEL
ALT: 50 U/L — ABNORMAL HIGH (ref 0–44)
AST: 33 U/L (ref 15–41)
Albumin: 2.5 g/dL — ABNORMAL LOW (ref 3.5–5.0)
Alkaline Phosphatase: 65 U/L (ref 38–126)
Anion gap: 8 (ref 5–15)
BUN: 16 mg/dL (ref 6–20)
CO2: 24 mmol/L (ref 22–32)
Calcium: 8.4 mg/dL — ABNORMAL LOW (ref 8.9–10.3)
Chloride: 102 mmol/L (ref 98–111)
Creatinine, Ser: 0.67 mg/dL (ref 0.61–1.24)
GFR, Estimated: >60 mL/min (ref >60)
Glucose, Bld: 103 mg/dL — ABNORMAL HIGH (ref 70–99)
Potassium: 4.6 mmol/L (ref 3.5–5.1)
Sodium: 134 mmol/L — ABNORMAL LOW (ref 135–145)
Total Bilirubin: 1.8 mg/dL — ABNORMAL HIGH (ref 0.3–1.2)
Total Protein: 7.3 g/dL (ref 6.5–8.1)

## 2022-03-15 LAB — MAGNESIUM: Magnesium: 2 mg/dL (ref 1.7–2.4)

## 2022-03-15 LAB — PHOSPHORUS: Phosphorus: 4.2 mg/dL (ref 2.5–4.6)

## 2022-03-15 LAB — GLUCOSE, CAPILLARY: Glucose-Capillary: 93 mg/dL (ref 70–99)

## 2022-03-15 MED ORDER — ENSURE ENLIVE PO LIQD
237.0000 mL | Freq: Three times a day (TID) | ORAL | Status: DC
  Administered 2022-03-15 – 2022-03-16 (×2): 237 mL via ORAL

## 2022-03-15 NOTE — Progress Notes (Signed)
Nutrition Follow-up ? ?DOCUMENTATION CODES:  ? ?Obesity unspecified ? ?INTERVENTION:  ? ?-Increase Ensure Enlive po to TID, each supplement provides 350 kcal and 20 grams of protein ?-MVI with minerals daily ?-TPN management per pharmacy ? ?NUTRITION DIAGNOSIS:  ? ?Increased nutrient needs related to post-op healing as evidenced by estimated needs. ? ?Ongoing ? ?GOAL:  ? ?Patient will meet greater than or equal to 90% of their needs ? ?Progressing  ? ?MONITOR:  ? ?Labs, Weight trends, Skin, I & O's ? ?REASON FOR ASSESSMENT:  ? ?Consult ?New TPN/TNA ? ?ASSESSMENT:  ? ?Erik Johns is a 41 y.o. male presenting with a 3 day history of generalized cramping abdominal pain.  The patient noticed some mild pain in the left lower quadrant starting the night of 03/02/22.  He went to work and thought that he might be constipated.  He has had some issues with constipation in the past and taking a laxative has helped.  He had liquid bowel movements but his pain did not resolve.  On 3/18, the pain remained similar, but on the night of 3/19, his pain became worse.  He went to work again, but ended up coming to the ER due to worsening pain.  Denies any fevers but reports feeling chills.  Denies any nausea or vomiting, chest pain, shortness of breath.  In the ER, his workup showed a WBC of 17.4.  He had a CT scan of abdomen/pelvis which showed perforated sigmoid diverticulitis, with inflammatory changes of the sigmoid colon with a few bubbles of gas in pericolonic region.  No diffuse free air, no abscess.  I have personally viewed the images.  The patient denies any prior episodes. ? ?3/23- s/p PROCEDURES: ?1. Drainage of intra-abdominal abscess interloop ?2. Reduction of internal Hernia ?3. 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 Splenic Flexure takedown ?5. Appendectomy ?6. Partial omentectomy ?3/24- pulled NGT overnight and refused replacement ?3/27- advanced to clear liquid  diet ?3/28- advanced to full liquid diet ?3/29- advanced to soft diet ? ?Reviewed I/O's: -3.3 L x 24 hours and +4.1 L since admission ? ?UOP: 3 L x 24 hours ? ?Colostomy output: 250 ml x 24 hours ? ?Pt unavailable at time of visit. Attempted to speak with pt via call to hospital room phone, however, unable to reach.  ? ?Pt advanced to soft diet. Noted meal completions 50%. Pt is taking Ensure supplements per MAR.  ? ?Plan to continue weaning TPN- pt currently receiving at 50 ml/hr, which provides 1180 kcals and 70 grams protein, meeting 49% of estimated kcal needs and 56% of estimated protein needs.  ? ?Per MD notes, likely discharge home tomorrow (03/16/22) with home health services.  ? ?Labs reviewed: Na: 134, CBGS: 93-122 (inpatient orders for glycemic control are 0-9 units insulin aspart every 6 hours).   ? ?Diet Order:   ?Diet Order   ? ?       ?  DIET SOFT Room service appropriate? Yes; Fluid consistency: Thin  Diet effective now       ?  ? ?  ?  ? ?  ? ? ?EDUCATION NEEDS:  ? ?Education needs have been addressed ? ?Skin:  Skin Assessment: Skin Integrity Issues: ?Skin Integrity Issues:: Incisions ?Incisions: closed abdomen ? ?Last BM:  03/15/22 (via colostomy) ? ?Height:  ? ?Ht Readings from Last 1 Encounters:  ?03/04/22 6' (1.829 m)  ? ? ?Weight:  ? ?Wt Readings from Last 1 Encounters:  ?03/13/22 122.8  kg  ? ? ?Ideal Body Weight:  80.9 kg ? ?BMI:  Body mass index is 36.72 kg/m?. ? ?Estimated Nutritional Needs:  ? ?Kcal:  2400-2600 ? ?Protein:  125-150 grams ? ?Fluid:  > 2 L ? ? ? ?Loistine Chance, RD, LDN, CDCES ?Registered Dietitian II ?Certified Diabetes Care and Education Specialist ?Please refer to [facility] for RD and/or RD on-call/weekend/after hours pager  ?

## 2022-03-15 NOTE — TOC Progression Note (Addendum)
Transition of Care (TOC) - Progression Note  ? ? ?Patient Details  ?Name: Erik Johns ?MRN: [id number] ?Date of Birth: February 05, 1981 ? ?Transition of Care (TOC) CM/SW Contact  ?Beverly Sessions, RN ?Phone Number: ?03/15/2022, 3:08 PM ? ?Clinical Narrative:    ? ?Added to AVS for unit secretary to make new patient appointment at [place]  ?- RW to be delivered to room by [facility]  ?- Spoke with [facility] surgery PA.  Patient is aware that he will not have home health, and declined for Thedore Mins to make ostomy clinic referral  ? ?TOC signing off please consult if indicated  ? ?Expected Discharge Plan: [place] ?Barriers to Discharge: Continued Medical Work up ? ?Expected Discharge Plan and Services ?Expected Discharge Plan: [place] ?  ?  ?  ?Living arrangements for the past 2 months: [place] ?                ?  ?  ?  ?  ?  ?  ?  ?  ?  ?  ? ? ?Social Determinants of Health (SDOH) Interventions ?  ? ?Readmission Risk Interventions ?   ? View : No data to display.  ?  ?  ?  ? ? ?

## 2022-03-15 NOTE — Progress Notes (Signed)
PT Cancellation Note ? ?Patient Details ?Name: Erik Johns ?MRN: [id number] ?DOB: [birth year] ? ? ?Cancelled Treatment:    Reason Eval/Treat Not Completed: Other (comment).  Attempted to see pt, but pt reported he has already been up in the chair and done his exercises today.  Pt requesting not to perform therapy, but would be willing to tomorrow before d/c.  Will re-attempt tomorrow as medically necessary. ? ? ?Gwenlyn Saran, PT, DPT ?03/15/22, 4:00 PM ? ?

## 2022-03-15 NOTE — Progress Notes (Signed)
PT Cancellation Note ? ?Patient Details ?Name: Erik Johns ?MRN: [id number] ?DOB: 03/11/1981 ? ? ?Cancelled Treatment:    Reason Eval/Treat Not Completed: Other (comment).  Chart reviewed and attempted to see pt.  Upon arrival pt noting spasms in the abdomen due to eating.  Pt requesting to be seen at later time if possible.   ? ? ?Gwenlyn Saran, PT, DPT ?03/15/22, 10:47 AM ? ?

## 2022-03-15 NOTE — Progress Notes (Signed)
PHARMACY - TOTAL PARENTERAL NUTRITION CONSULT NOTE  ? ?Indication: Prolonged ileus ? ?Patient Measurements: ?Height: 6' (182.9 cm) ?Weight: 122.8 kg (270 lb 11.6 oz) ?IBW/kg (Calculated) : 77.6 ?TPN AdjBW (KG): 84.3 ?Body mass index is 36.72 kg/m?. ? ?Assessment: 41 y.o. male w/ no pertinent PMH s/p exploratory laparotomy, colectomy, colostomy, and appendectomy for perforated diverticulitis with feculent peritonitis.  ? ?Glucose / Insulin: BG <180, 1 u insulin past 24 hrs ?Electrolytes: WNL ?Renal: SCr <1, stable ?Hepatic: AST/ALT mildly elevated ?Intake / Output; MIVF:  ?GI Imaging: ?KUB (03/09/2022) ?1. Resolved small bowel obstruction. ?2. Postsurgical changes with new colostomy and surgical drains in ?the left upper and lower quadrants. ?3. Left pleural effusion. ? ?CT (03/13/22) ?Status post Hartmann's procedure for perforate diverticulitis. 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 Probable mild ileus pattern of the small bowel. ? ?GI Surgeries / Procedures: s/p exploratory laparotomy, colectomy, colostomy, and appendectomy 03/08/22 ? ?Central access: 03/09/22 ?TPN start date: 03/09/22 ? ?Nutritional Goals: ?Goal TPN rate is 100 mL/hr (provides 139 g of protein and 2479 kcals per day) ? ?RD Assessment: ?Estimated Needs ?Total Energy Estimated Needs: 2400-2600 ?Total Protein Estimated Needs: 125-150 grams ?Total Fluid Estimated Needs: > 2 L ?Current Nutrition:  ?NPO ? ?Plan:  ?stop TPN when current bag is complete per MD order ?stop Sensitive q6h SSI  ? ?Dallie Piles ?03/15/2022,7:35 AM ? ?

## 2022-03-15 NOTE — Consult Note (Signed)
[facility] Nurse ostomy follow up ?Patient receiving care in [facility] 228. His mother arrived about 1/2 way through the pouch system change. ?Stoma type/location: LUQ colostomy ?Stomal assessment/size: 1.5 inches, round, pink, moist, sutures intact ?Peristomal assessment: intact ?Treatment options for stomal/peristomal skin: barrier ring ?Output: soft brown ?Ostomy pouching: 2pc. 2 and 3/4 inch system ?Education provided:  ?Today patient did all the pouch change processes with very few interventions from me.  He did a superior job with these tasks.  I provided 4 skin barriers, 4 pouches, 4 rings for future use. ?Enrolled patient in [facility] Discharge program: Yes and his mother reports the box has arrived at his residence.  He lives with his aunt. ? ?Val Riles, RN, MSN, CWOCN, CNS-BC, pager [phone]  ?

## 2022-03-15 NOTE — Progress Notes (Addendum)
[facility] ?SURGICAL PROGRESS NOTE ? ?Hospital Day(s): 10.  ? ?Post op day(s): 7 Days Post-Op.  ? ?Interval History:  ?Patient seen and examined ?Patient reports he is doing well; not much appetite ?Abdominal pain controlled with medications ?No fever, chills, nausea, emesis ?Leukocytosis continues to improve; down to 20.6K ?Hgb stable at 9.4 ?Renal function is normal; sCr - 0.67; UO - 3.0L + unmeasured ?Mild hyponatremia to 134; otherwise no electrolyte derangements  ?RUQ surgical drain with 12 ccs: serous ?RLQ surgical drain with 16 ccs; serous ?He is on soft diet; tolerating well ?Continues on Zosyn  ?He has passed flatus/stool from colostomy ?Worked well with therapies ?Unfortunately he was not accepted for home health vac  ? ?Vital signs in last 24 hours: [min-max] current  ?Temp:  [98.1 ?F (36.7 ?C)-99.8 ?F (37.7 ?C)] 99 ?F (37.2 ?C) (03/30 1751) ?Pulse Rate:  [90-102] 90 (03/30 0821) ?Resp:  [16-20] 16 (03/30 0258) ?BP: (133-142)/(88-101) 142/101 (03/30 5277) ?SpO2:  [97 %-98 %] 97 % (03/30 0821)     Height: 6' (182.9 cm) Weight: 122.8 kg BMI (Calculated): 36.71  ? ?Intake/Output last 2 shifts:  ?03/29 0701 - 03/30 0700 ?In: 0  ?Out: 3303 [OEUMP:5361; Drains:28; Stool:250]  ? ?Physical Exam:  ?Constitutional: Alert, cooperative and no distress  ?Respiratory: Breathing non-labored at rest  ?Cardiovascular: regular rate and sinus rhythm  ?Gastrointestinal: Soft, expected incisional soreness, non-distended, no rebound/guarding. 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 Surgical drains in RUQ which is serous, RLQ drain more serosanguinous  ?Integumentary: Midline incision widely open with clean, moist, sub-cutaneous adipose, clean viable dermal perimeter, and no discharge ? ?Labs:  ? ?  Latest Ref Rng & Units 03/15/2022  ?  5:39 AM 03/14/2022  ?  5:30 AM 03/13/2022  ?  8:55 AM  ?CBC  ?WBC 4.0 - 10.5 K/uL 20.6   23.8   25.9     ?Hemoglobin 13.0 - 17.0 g/dL 9.4   9.3   9.7    ?Hematocrit 39.0 - 52.0 % 29.3   29.3   29.8    ?Platelets 150 - 400 K/uL 537   504   466    ? ? ?  Latest Ref Rng & Units 03/15/2022  ?  5:39 AM 03/13/2022  ?  8:28 AM 03/12/2022  ?  5:59 AM  ?CMP  ?Glucose 70 - 99 mg/dL 103   129   119    ?BUN 6 - 20 mg/dL '16   14   17    '$ ?Creatinine 0.61 - 1.24 mg/dL 0.67   0.58   0.65    ?Sodium 135 - 145 mmol/L 134   136   138    ?Potassium 3.5 - 5.1 mmol/L 4.6   4.1   3.3    ?Chloride 98 - 111 mmol/L 102   102   103    ?CO2 22 - 32 mmol/L '24   25   27    '$ ?Calcium 8.9 - 10.3 mg/dL 8.4   8.2   7.9    ?Total Protein 6.5 - 8.1 g/dL 7.3    5.7    ?Total Bilirubin 0.3 - 1.2 mg/dL 1.8    2.9    ?Alkaline Phos 38 - 126 U/L 65    51    ?AST 15 - 41 U/L 33    51    ?ALT 0 - 44 U/L 50    51    ? ? ?Imaging  studies:  ?No new pertinent imaging studies ? ? ?Assessment/Plan:  ?41 y.o. male 7 Days Post-Op s/p exploratory laparotomy, colectomy, colostomy, and appendectomy for perforated diverticulitis with feculent peritonitis.  ? ? - Continue soft diet ? - Stop TPN today after bag completed ? - Monitor leukocytosis; improved further ? - Continue IV Abx (Zosyn); will transition to PO at discharge to complete 14 days from date of surgery  ? - Continue surgical drains; monitor and record output; may be able to DC RUQ drain before discharge  ? - Midline wound care: Continue wet to dry dressing changes daily ? - Monitor abdominal examination ? - Pain medication prn; antiemetics prn ? - WOC following ? - Mobilization encouraged; PT following; DME ordered   ? ? - Discharge Planning: Anticipate DC tomorrow (03/31). Appreciate CSW work in attempting to get him [facility] coverage and wound vac. Unfortunately, this was unsuccessful. He will need to continue dressing changes daily at home, and he reports he can get help with this.  ? ?All of the above findings and recommendations were discussed with the patient, patient's family, and the medical team, and all of  patient's and family's questions were answered to their expressed satisfaction. ? ?-- ?Edison Simon, PA-C ?[facility] ?03/15/2022, 8:47 AM ?[phone] ?M-F: 7am - 4pm ? ?

## 2022-03-16 MED ORDER — METRONIDAZOLE 500 MG PO TABS: 500.0000 mg | ORAL_TABLET | Freq: Three times a day (TID) | ORAL | 0 refills | Status: AC

## 2022-03-16 MED ORDER — CIPROFLOXACIN HCL 500 MG PO TABS: 500.0000 mg | ORAL_TABLET | Freq: Two times a day (BID) | ORAL | 0 refills | Status: AC

## 2022-03-16 MED ORDER — AMOXICILLIN-POT CLAVULANATE 875-125 MG PO TABS: 1.0000 | ORAL_TABLET | Freq: Two times a day (BID) | ORAL | 0 refills | Status: DC

## 2022-03-16 MED ORDER — CYCLOBENZAPRINE HCL 10 MG PO TABS: 10.0000 mg | ORAL_TABLET | Freq: Three times a day (TID) | ORAL | 0 refills | Status: DC | PRN

## 2022-03-16 MED ORDER — OXYCODONE-ACETAMINOPHEN 5-325 MG PO TABS: 1.0000 | ORAL_TABLET | Freq: Four times a day (QID) | ORAL | 0 refills | Status: DC | PRN

## 2022-03-16 NOTE — Discharge Summary (Signed)
[facility] ?SURGICAL DISCHARGE SUMMARY ? ?Patient ID: ?Erik Johns ?MRN: [id number] ?DOB/AGE: 39-Jan-1982 39 y.o. ? ?Admit date: 03/04/2022 ?Discharge date: 03/16/2022 ? ?Discharge Diagnoses ?Patient Active Problem List  ? Diagnosis Date Noted  ? Diverticulitis of colon with perforation 03/05/2022  ? ? ?Consultants ?None ? ?Procedures ?03/08/2022:  ?1. Drainage of intra-abdominal abscess interloop ?2. Reduction of internal Hernia ?3. 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 Splenic Flexure takedown ?5. Appendectomy ?6. Partial omentectomy ? ?HPI: Erik Johns is a 41 y.o. male presenting with a 3 day history of generalized cramping abdominal pain.  The patient noticed some mild pain in the left lower quadrant starting the night of 03/02/22.  He went to work and thought that he might be constipated.  He has had some issues with constipation in the past and taking a laxative has helped.  He had liquid bowel movements but his pain did not resolve.  On 3/18, the pain remained similar, but on the night of 3/19, his pain became worse.  He went to work again, but ended up coming to the ER due to worsening pain.  Denies any fevers but reports feeling chills.  Denies any nausea or vomiting, chest pain, shortness of breath.  In the ER, his workup showed a WBC of 17.4.  He had a CT scan of abdomen/pelvis which showed perforated sigmoid diverticulitis, with inflammatory changes of the sigmoid colon with a few bubbles of gas in pericolonic region.  No diffuse free air, no abscess.  I have personally viewed the images.  The patient denies any prior episodes. ? ?Hospital Course: He was admitted to general surgery and attempts were made at conservative management; however, he continued to have tachycardia, fever, and leukocytosis. On HD03, he underwent CT Abdomen/Pelvis which was concerning for worsening perforated diverticulitis with increased intra-peritoneal air and  free fluid. The decision was made to proceed with Hartman's. Informed consent was obtained and documented, and patient underwent uneventful Hartman's (Dr Dahlia Byes, 03/08/2022).  Post-operatively, patient did reasonably well. He did have transient increase in his leukocytosis post-operatively but CT Abdomen/Pelvis On 03/28 was reassuring and without any intra-abdominal process. His leukocytosis ultimately improved. Advancement of patient's diet and ambulation were well-tolerated. The remainder of patient's hospital course was essentially unremarkable, and discharge planning was initiated accordingly with patient safely able to be discharged home with appropriate discharge instructions, antibiotics (Augmentin x6 days), pain control, and outpatient follow-up after all of his questions were answered to his expressed satisfaction.  ? ?Discharge Condition: Good ? ? ?Physical Examination:  ?Constitutional: Alert, cooperative and no distress  ?Respiratory: Breathing non-labored at rest  ?Cardiovascular: regular rate and sinus rhythm  ?Gastrointestinal: Soft, expected incisional soreness, non-distended, no rebound/guarding. 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 Surgical drains in RUQ which is serous, RLQ drain more serosanguinous  ?Integumentary: Midline incision widely open with clean, moist, sub-cutaneous adipose, clean viable dermal perimeter, and no discharge ? ? ?Allergies as of 03/16/2022   ?No Known Allergies ?  ? ?  ?Medication List  ?  ? ?TAKE these medications   ? ?amoxicillin-clavulanate 875-125 MG tablet ?Commonly known as: Augmentin ?Take 1 tablet by mouth 2 (two) times daily for 6 days. ?  ?cyclobenzaprine 10 MG tablet ?Commonly known as: FLEXERIL ?Take 1 tablet (10 mg total) by mouth 3 (three) times daily as needed for muscle spasms. ?  ?oxyCODONE-acetaminophen 5-325 MG tablet ?Commonly known as: PERCOCET/ROXICET ?  Take 1-2 tablets by mouth every 6  (six) hours as needed for moderate pain. ?  ? ?  ? ?  ?  ? ? ?  ?Durable Medical Equipment  ?(From admission, onward)  ?  ? ? ?  ? ?  Start     Ordered  ? 03/14/22 1639  For home use only DME Walker rolling  Once       ?Question Answer Comment  ?Walker: With 5 Inch Wheels   ?Patient needs a walker to treat with the following condition Physical deconditioning   ?  ? 03/14/22 1638  ? ?  ?  ? ?  ? ? ?  ?Discharge Care Instructions  ?(From admission, onward)  ?  ? ? ?  ? ?  Start     Ordered  ? 03/16/22 0000  Discharge wound care:       ?Comments: Midline Wound: Pack daily with saline moistened Kerlix gauze, cover, secure.  ? 03/16/22 1054  ? ?  ?  ? ?  ? ? ? ? Follow-up Information   ? ? [facility]. Schedule an appointment as soon as possible for a visit.   ?Why: Prior to discharge please make patient a new PCP appointment ?Contact information: ?[address] ?[place] [address] ?[phone] ? ? ?  ?  ? ? Tylene Fantasia, PA-C. Schedule an appointment as soon as possible for a visit in 1 week(s).   ?Specialty: Physician Assistant ?Why: Follow up next week for drain removal; s/p Hartman's ?Contact information: ?[address] ?[address] ?[place] [address] ?[phone] ? ? ?  ?  ? ?  ?  ? ?  ? ? ? ?Time spent on discharge management including discussion of hospital course, clinical condition, outpatient instructions, prescriptions, and follow up with the patient and members of the medical team: >30 minutes ? ?-- ?Edison Simon , PA-C ? [facility]  ?03/16/2022, 10:54 AM ?[phone] ?M-F: 7am - 4pm ? ?

## 2022-03-16 NOTE — Progress Notes (Signed)
Patient discharged to home with all belongings. Patient given wound care instructions and supplies. Patient expressed understanding by using the teach back method. RUQ JP drain removed per order. Catheter intact after removal. Patient given medication directions and expressed understanding. Double Lumen PICC removed with no complications. Patient VSS. No complaints or questions about discharge at this time. ?

## 2022-03-16 NOTE — Progress Notes (Signed)
Physical Therapy Treatment ?Patient Details ?Name: Erik Johns ?MRN: [id number] ?DOB: [birth year] ?Today's Date: 03/16/2022 ? ? ?History of Present Illness 41 yo male with onset of crampy abd pain for three days was admitted 3/19, now has been found to have perforated sigmoid diverticulitis.  Received EXPLORATORY LAPAROTOMY COLECTOMY WITH COLOSTOMY CREATION/HARTMANN PROCEDURE  APPENDECTOMY.  PMHx:  none pertinent ? ?  ?PT Comments  ? ? Pt was [place] sitting in bed upon arriving. He agrees to session and is cooperative and pleasant throughout. Easily able to exit bed, stand, and ambulate with pushing IV pole at first however progressed to no UE support. Pt is cleared from an acute PT standpoint for safe DC home. No PT needs identified post acute admission.  ?  ?Recommendations for follow up therapy are one component of a multi-disciplinary discharge planning process, led by the attending physician.  Recommendations may be updated based on patient status, additional functional criteria and insurance authorization. ? ?Follow Up Recommendations ? No PT follow up ?  ?  ?Assistance Recommended at Discharge PRN  ?Patient can return home with the following Assist for transportation;Assistance with cooking/housework;A little help with bathing/dressing/bathroom ?  ?Equipment Recommendations ? None recommended by PT  ?  ?   ?Precautions / Restrictions Precautions ?Precautions: Fall ?Restrictions ?Weight Bearing Restrictions: No  ?  ? ?Mobility ? Bed Mobility ?Overal bed mobility: Modified Independent ? General bed mobility comments: easily and safely able to exit bed without physical assistance ?  ? ?Transfers ?Overall transfer level: Modified independent ?Equipment used: None ?  ?  ?Ambulation/Gait ?Ambulation/Gait assistance: Supervision, Modified independent (Device/Increase time) ?Gait Distance (Feet): 200 Feet ?Assistive device: IV Pole, None ?Gait Pattern/deviations: WFL(Within Functional Limits) ?Gait velocity: WNL ?  ?   ?General Gait Details: pt was able to safely ambulate in hallway without LOB or safety concern. has RW for home use however was able to ambulate without AD without LOB. ? ?  ?Balance Overall balance assessment: Modified Independent ?  ?   ?Cognition Arousal/Alertness: Awake/alert ?Behavior During Therapy: [facility] for tasks assessed/performed ?Overall Cognitive Status: Within Functional Limits for tasks assessed ?  ?   ?General Comments: Pt is A and O x 4 ?  ?  ? ?  ?   ?General Comments General comments (skin integrity, edema, etc.): reviewed what he need  to do for recovery from a PT standpoint at home. he does not need OPPT at DC. No follow up required ?  ?  ? ?Pertinent Vitals/Pain Pain Assessment ?Pain Assessment: No/denies pain ?Faces Pain Scale: No hurt ?Pain Intervention(s): Limited activity within patient's tolerance, Monitored during session, Premedicated before session, Repositioned  ? ? ? ?PT Goals (current goals can now be found in the care plan section) Acute Rehab PT Goals ?Patient Stated Goal: to get home and walk/strengthen his legs ?Progress towards PT goals: Progressing toward goals ? ?  ?Frequency ? ? ? Min 2X/week ? ? ? ?  ?PT Plan Discharge plan needs to be updated  ? ? ?   ?AM-PAC PT "6 Clicks" Mobility   ?Outcome Measure ? Help needed turning from your back to your side while in a flat bed without using bedrails?: None ?Help needed moving from lying on your back to sitting on the side of a flat bed without using bedrails?: A Little ?Help needed moving to and from a bed to a chair (including a wheelchair)?: A Little ?Help needed standing up from a chair using your arms (e.g., wheelchair or bedside chair)?:  A Little ?Help needed to walk in hospital room?: A Little ?Help needed climbing 3-5 steps with a railing? : A Little ?6 Click Score: 19 ? ?  ?End of Session   ?Activity Tolerance: Patient tolerated treatment well ?Patient left: in chair;with chair alarm set;with call bell/phone within  reach ?Nurse Communication: Mobility status ?PT Visit Diagnosis: Muscle weakness (generalized) (M62.81);Pain ?  ? ? ?Time: 7614-7092 ?PT Time Calculation (min) (ACUTE ONLY): 13 min ? ?Charges:  $Gait Training: 8-22 mins          ?          ? ?Julaine Fusi PTA ?03/16/22, 11:17 AM  ? ?

## 2022-03-16 NOTE — Discharge Instructions (Signed)
In addition to included general post-operative instructions, ? ?Diet: Resume home diet.  ? ?Activity: No heavy lifting >20 pounds (children, pets, laundry, garbage) for 6 weeks, but light activity and walking are encouraged. Do not drive or drink alcohol if taking narcotic pain medications or having pain that might distract from driving. ? ?Wound care: Continue to pack midline wound daily with saline moistened Kerlix gauze; cover and secure. This needs to be done daily and as needed.  ? ?Drain: Monitor and record drain output daily; hand out given. I will likely remove remaining drain in follow up.  ? ?Medications: Resume all home medications. For mild to moderate pain: acetaminophen (Tylenol) or ibuprofen/naproxen (if no kidney disease). Combining Tylenol with alcohol can substantially increase your risk of causing liver disease. Narcotic pain medications, if prescribed, can be used for severe pain, though may cause nausea, constipation, and drowsiness. Do not combine Tylenol and Percocet (or similar) within a 6 hour period as Percocet (and similar) contain(s) Tylenol. If you do not need the narcotic pain medication, you do not need to fill the prescription. ? ?Call office [phone] / [phone]) at any time if any questions, worsening pain, fevers/chills, bleeding, drainage from incision site, or other concerns. ? ?

## 2022-03-22 ENCOUNTER — Encounter: Payer: Self-pay | Admitting: Physician Assistant

## 2022-03-22 ENCOUNTER — Ambulatory Visit (INDEPENDENT_AMBULATORY_CARE_PROVIDER_SITE_OTHER): Payer: Commercial Managed Care - PPO | Admitting: Physician Assistant

## 2022-03-22 VITALS — BP 115/83 | HR 105 | Temp 98.4°F | Ht 72.0 in | Wt 233.0 lb

## 2022-03-22 DIAGNOSIS — K5720 Diverticulitis of large intestine with perforation and abscess without bleeding: Secondary | ICD-10-CM

## 2022-03-22 DIAGNOSIS — Z09 Encounter for follow-up examination after completed treatment for conditions other than malignant neoplasm: Secondary | ICD-10-CM

## 2022-03-22 NOTE — Progress Notes (Signed)
[facility] ?POST-OP OFFICE VISIT ? ?03/22/2022 ? ?HPI: ?Erik Johns is a 41 y.o. male 14 days s/p Hartman's Procedure for perforated diverticulitis with Dr Dahlia Byes  ? ?He is doing exceptionally well given the circumstances ?Does have abdominal discomfort but this is mostly at nighttime; pain medications are helping ?No fever, chills, nausea, emesis ?No issues with midline wound care ?Colostomy is functioning well ?Remaining drain with less than 10 ccs daily. There is clotted blod in tubing. I did strip this and there was no fluid residual behind this.  ?No other complaints  ? ?Vital signs: ?BP 115/83   Pulse (!) 105   Temp 98.4 ?F (36.9 ?C)   Ht 6' (1.829 m)   Wt 233 lb (105.7 kg)   SpO2 97%   BMI 31.60 kg/m?   ? ?Physical Exam: ?Constitutional: Well appearing male, NAD ?Abdomen: Soft, non-tender, non-distended, no rebound/guarding. Surgical drain in the RLQ; output was serosanguinous with some clotted blood, stripping tubing did not revealed any residual fluid (removed). Colostomy in left mid-abdomen is pink and patent with gas and stool in bag ?Skin: Midline incision is healing via secondary intention, healthy granulation tissue with minimal fibrinous exudate, fascia grossly intact. Dressing changed ? ?Assessment/Plan: ?This is a 41 y.o. male 14 days s/p Hartman's Procedure for perforated diverticulitis ? ? - Pain control prn; current regimen is doing well ? - Complete Abx course ? - Reviewed wound care instructions; dressing changed at bedside  ? - Continue to monitor colostomy output; supplies given  ? - Drain removed; placed occlusive dressing  ? - Reviewed lifting restrictions; 6 weeks total ? - Reviewed surgical pathology; Diverticulitis with abscess, normal appendix, inflamed omentum, negative for malignancy  ? - He can follow up in 2-3 weeks  ? ?-- ?Edison Simon, PA-C ?[facility] ?03/22/2022, 3:47 PM ?[phone] ?M-F: 7am - 4pm ? ?

## 2022-03-22 NOTE — Patient Instructions (Addendum)
Dressing changes daily. Follow up here in 2 weeks.  ? ? ?GENERAL POST-OPERATIVE ?PATIENT INSTRUCTIONS  ? ?WOUND CARE INSTRUCTIONS:  Keep a dry clean dressing on the wound if there is drainage. The initial bandage may be removed after 24 hours.  Once the wound has quit draining you may leave it open to air.  If clothing rubs against the wound or causes irritation and the wound is not draining you may cover it with a dry dressing during the daytime.  Try to keep the wound dry and avoid ointments on the wound unless directed to do so.  If the wound becomes bright red and painful or starts to drain infected material that is not clear, please contact your physician immediately.  If the wound is mildly pink and has a thick firm ridge underneath it, this is normal, and is referred to as a healing ridge.  This will resolve over the next 4-6 weeks. ? ?BATHING: ?You may shower if you have been informed of this by your surgeon. However, Please do not submerge in a tub, hot tub, or pool until incisions are completely sealed or have been told by your surgeon that you may do so. ? ?DIET:  You may eat any foods that you can tolerate.  It is a good idea to eat a high fiber diet and take in plenty of fluids to prevent constipation.  If you do become constipated you may want to take a mild laxative or take ducolax tablets on a daily basis until your bowel habits are regular.  Constipation can be very uncomfortable, along with straining, after recent surgery. ? ?ACTIVITY:  You are encouraged to cough and deep breath or use your incentive spirometer if you were given one, every 15-30 minutes when awake.  This will help prevent respiratory complications and low grade fevers post-operatively if you had a general anesthetic.  You may want to hug a pillow when coughing and sneezing to add additional support to the surgical area, if you had abdominal or chest surgery, which will decrease pain during these times.  You are encouraged to walk  and engage in light activity for the next two weeks.  You should not lift more than 20 pounds for 6 weeks total after surgery as it could put you at increased risk for complications.  Twenty pounds is roughly equivalent to a plastic bag of groceries. At that time- Listen to your body when lifting, if you have pain when lifting, stop and then try again in a few days. Soreness after doing exercises or activities of daily living is normal as you get back in to your normal routine. ? ?MEDICATIONS:  Try to take narcotic medications and anti-inflammatory medications, such as tylenol, ibuprofen, naprosyn, etc., with food.  This will minimize stomach upset from the medication.  Should you develop nausea and vomiting from the pain medication, or develop a rash, please discontinue the medication and contact your physician.  You should not drive, make important decisions, or operate machinery when taking narcotic pain medication. ? ?SUNBLOCK ?Use sun block to incision area over the next year if this area will be exposed to sun. This helps decrease scarring and will allow you avoid a permanent darkened area over your incision. ? ?QUESTIONS:  Please feel free to call our office if you have any questions, and we will be glad to assist you. [phone] ? ? ?

## 2022-03-27 ENCOUNTER — Telehealth: Payer: Self-pay | Admitting: *Deleted

## 2022-03-27 NOTE — Telephone Encounter (Signed)
Faxed FMLA to [employer] [facility] Team at [phone] 

## 2022-03-29 ENCOUNTER — Encounter: Payer: Commercial Managed Care - PPO | Admitting: Physician Assistant

## 2022-04-05 ENCOUNTER — Encounter: Payer: Self-pay | Admitting: Physician Assistant

## 2022-04-05 ENCOUNTER — Ambulatory Visit (INDEPENDENT_AMBULATORY_CARE_PROVIDER_SITE_OTHER): Payer: Commercial Managed Care - PPO | Admitting: Physician Assistant

## 2022-04-05 VITALS — BP 151/100 | HR 111 | Temp 98.4°F | Ht 72.0 in | Wt 236.4 lb

## 2022-04-05 DIAGNOSIS — Z09 Encounter for follow-up examination after completed treatment for conditions other than malignant neoplasm: Secondary | ICD-10-CM

## 2022-04-05 DIAGNOSIS — K5720 Diverticulitis of large intestine with perforation and abscess without bleeding: Secondary | ICD-10-CM

## 2022-04-05 NOTE — Patient Instructions (Addendum)
A referral has been placed to [facility]. They will call you for an appointment.  ? ? ?If you have any concerns or questions, please feel free to call our office. See follow up appointment.  ? ?Exploratory Laparotomy, Adult, Care After ?The following information offers guidance on how to care for yourself after your procedure. Your health care provider may also give you more specific instructions. If you have problems or questions, contact your health care provider. ?What can I expect after the procedure? ?After the procedure, it is common to have: ?Abdominal soreness. ?Fatigue. ?Bloating. ?Gas. ?A sore throat from having had a breathing or draining tube in your throat. ?A lack of appetite. ?Follow these instructions at home: ?Medicines ?Take over-the-counter and prescription medicines only as told by your health care provider. ?If you were prescribed an antibiotic medicine, take it as told by your health care provider. Do not stop taking the antibiotic even if you start to feel better. ?Ask your health care provider if the medicine prescribed to you: ?Requires you to avoid driving or using machinery. ?Can cause constipation. You may need to take these actions to prevent or treat constipation: ?Drink enough fluid to keep your urine pale yellow. ?Take over-the-counter or prescription medicines. Undergoing surgery and taking pain medicines can make constipation worse. ?Eat foods that are high in fiber, such as beans, whole grains, and fresh fruits and vegetables. ?Limit foods that are high in fat and processed sugars, such as fried or sweet foods. ?Incision care ? ?Follow instructions from your health care provider about how to take care of your incision. Make sure you: ?Wash your hands with soap and water for at least 20 seconds before and after you change your bandage (dressing). If soap and water are not available, use hand sanitizer. ?Change your dressing as told by your health care provider. ?Leave stitches  (sutures), skin glue, or adhesive strips in place. These skin closures may need to stay in place for 2 weeks or longer. If adhesive strip edges start to loosen and curl up, you may trim the loose edges. Do not remove adhesive strips completely unless your health care provider tells you to do that. ?If you were sent home with a drain, follow instructions from your health care provider about how to care for it. ?Check your incision area every day for signs of infection. Check for: ?Redness, swelling, or pain. ?Fluid or blood. ?Warmth. ?Pus or a bad smell. ?Activity ? ?Rest as told by your health care provider. ?Avoid sitting for a Dorame time without moving. Get up to take short walks every 1-2 hours. This is important to improve blood flow and breathing. Ask for help if you feel weak or unsteady. ?Do not lift anything that is heavier than 5 lb (2.3 kg), or the limit that you are told, until your health care provider says that it is safe. ?Return to your normal activities as told by your health care provider. Ask your health care provider what activities are safe for you. ?Bathing ?Keep your incision clean and dry. Clean it as often as told by your health care provider. You may be told to: ?Gently wash the incision with soap and water. ?Rinse the incision with water to remove all soap. ?Pat the incision dry with a clean towel. Do not rub the incision. ?General instructions ?Do not use any products that contain nicotine or tobacco. These products include cigarettes, chewing tobacco, and vaping devices, such as e-cigarettes. These can delay incision healing after  surgery. If you need help quitting, ask your health care provider. ?Wear compression stockings as told by your health care provider. These stockings help to prevent blood clots and reduce swelling in your legs. ?Keep all follow-up visits. This is important. ?Contact a health care provider if: ?You have a fever or chills. ?Your pain medicine is not helping. ?You  have constipation or diarrhea. ?You have nausea or vomiting. ?You have drainage, redness, swelling, or pain at your incision site. ?Get help right away if: ?Your pain is getting worse. ?You have not had a bowel movement for more than 3 days. ?You have ongoing (persistent) vomiting. ?The edges of your incision open up. ?You have warmth, tenderness, or swelling in your calf. ?You have trouble breathing. ?You have chest pain. ?These symptoms may represent a serious problem that is an emergency. Do not wait to see if the symptoms will go away. Get medical help right away. Call your local emergency services (911 in the [place] Do not drive yourself to the hospital. ?Summary ?Abdominal soreness is common after exploratory laparotomy. Take over-the-counter and prescription medicines only as told by your health care provider. ?Follow instructions from your health care provider about how to take care of your incision. ?Do not lift anything that is heavier than 5 lb (2.3 kg), or the limit that you are told, until your health care provider says that it is safe. ?This information is not intended to replace advice given to you by your health care provider. Make sure you discuss any questions you have with your health care provider. ?Document Revised: 08/16/2020 Document Reviewed: 08/16/2020 ?Elsevier Patient Education ? [place]. ? ?

## 2022-04-05 NOTE — Progress Notes (Signed)
[facility] ?POST-OP OFFICE VISIT ? ?04/05/2022 ? ?HPI: ?Erik Johns is a 41 y.o. male 28 days s/p Hartman's Procedure for perforated diverticulitis with Dr Dahlia Byes  ? ?He is doing very well given the circumstances ?No issues with pain, fever, chills, nausea, emesis ?Colostomy is functioning well ?Incision continues to hal via secondary intention; he is doing well with dressings ?No other complaints  ? ?Vital signs: ?BP (!) 151/100   Pulse (!) 111   Temp 98.4 ?F (36.9 ?C) (Oral)   Ht 6' (1.829 m)   Wt 236 lb 6.4 oz (107.2 kg)   SpO2 95%   BMI 32.06 kg/m?   ? ?Physical Exam: ?Constitutional: Well appearing male, NAD ?Abdomen: Soft, non-tender, non-distended, no rebound/guarding. Colostomy in the left abdomen is very healthy, pink, patent, bag recently changed.  ?Skin: Laparotomy incision is healing via secondary intention, wound bed is 100% granulation tissue, some punctate bleeding, no erythema ? ?Assessment/Plan: ?This is a 41 y.o. male 28 days s/p Hartman's Procedure for perforated diverticulitis  ? ? - Pain control prn ? - Continue local wound care: pack midline wound with saline moistened gauzes, cover, and secure ? - Reviewed lifting restrictions; 6 weeks ? - Will send referral to GI for colonoscopy in ~3 months  ? - I will see him every 2-3 weeks for wound checks ? ?-- ?Edison Simon, PA-C ?[facility] ?04/05/2022, 2:29 PM ?M-F: 7am - 4pm ? ?

## 2022-04-23 ENCOUNTER — Encounter: Payer: Commercial Managed Care - PPO | Admitting: Surgery

## 2022-04-25 ENCOUNTER — Encounter: Payer: Self-pay | Admitting: Surgery

## 2022-04-25 ENCOUNTER — Ambulatory Visit (INDEPENDENT_AMBULATORY_CARE_PROVIDER_SITE_OTHER): Payer: Commercial Managed Care - PPO | Admitting: Surgery

## 2022-04-25 VITALS — BP 148/90 | HR 89 | Temp 98.0°F | Ht 72.0 in | Wt 236.0 lb

## 2022-04-25 DIAGNOSIS — Z7189 Other specified counseling: Secondary | ICD-10-CM

## 2022-04-25 DIAGNOSIS — Z09 Encounter for follow-up examination after completed treatment for conditions other than malignant neoplasm: Secondary | ICD-10-CM

## 2022-04-25 DIAGNOSIS — K5720 Diverticulitis of large intestine with perforation and abscess without bleeding: Secondary | ICD-10-CM

## 2022-04-25 NOTE — Patient Instructions (Signed)
We have placed a referral to the Ostomy clinic. They will call you to schedule this appointment.  ? ?We have placed a referral to [facility] for a colonoscopy for sometime in July or August. They will contact you to set this up. ? ?Follow up here in July.  ?

## 2022-04-25 NOTE — Progress Notes (Signed)
41 year old male following after Hartman's a month and a half ago.  He is doing very well.  Tolerating diet and doing wound care for his midline wound.  Ostomy is working but has not quite find the appropriate appliance ? ?PE NAD ?Abd: soft ostomy is pink and patent.  Midline wound healing well without evidence of infection.  No peritonitis ? ? ?A/P Doing very well ?Continue wound care ?Anticipate colostomy takedown in September or so. ?We will go ahead and send referral for GI for completion colonoscopy. ?I will see him back in a couple months. ?

## 2022-06-06 ENCOUNTER — Other Ambulatory Visit: Payer: Self-pay

## 2022-06-06 ENCOUNTER — Ambulatory Visit (INDEPENDENT_AMBULATORY_CARE_PROVIDER_SITE_OTHER): Payer: Commercial Managed Care - PPO | Admitting: Gastroenterology

## 2022-06-06 ENCOUNTER — Encounter: Payer: Self-pay | Admitting: Gastroenterology

## 2022-06-06 VITALS — BP 160/122 | HR 101 | Temp 97.9°F | Ht 72.0 in | Wt 254.1 lb

## 2022-06-06 DIAGNOSIS — Z933 Colostomy status: Secondary | ICD-10-CM | POA: Diagnosis not present

## 2022-06-06 DIAGNOSIS — K5720 Diverticulitis of large intestine with perforation and abscess without bleeding: Secondary | ICD-10-CM | POA: Diagnosis not present

## 2022-06-06 MED ORDER — CLENPIQ 10-3.5-12 MG-GM -GM/175ML PO SOLN: 175.0000 mL | Freq: Once | ORAL | 0 refills | Status: AC

## 2022-06-06 NOTE — Progress Notes (Signed)
Erik Darby, MD [place]  [place], [place] [address]  Main: [phone]  Fax: [phone]    Gastroenterology Consultation  Referring Provider:     Jules Husbands, MD Primary Care Physician:  Patient, No Pcp Per Primary Gastroenterologist:  Dr. Cephas Johns Reason for Consultation: History of perforated sigmoid diverticulitis        HPI:   Erik Johns is a 42 y.o. male referred by Dr. Patient, No Pcp Per  for consultation & management of history of perforated sigmoid diverticulitis.  Patient had history of perforated sigmoid diverticulitis s/p Hartman's procedure, reduction of internal hernia, left colectomy and sigmoid colectomy with end colostomy on 03/08/2022.  Patient also had appendectomy and partial omentectomy, drainage of intra-abdominal abscess.  Patient has done well since surgery and referred to GI for colonoscopy and examination of Hartman's pouch before takedown.  Patient reports that he has been doing well since surgery.  He reports having regular stoma output, denies any bleeding, pain, abdominal distention.  Patient smokes cigarettes daily Drinks alcohol socially Patient denies any family history of GI malignancy  NSAIDs: None  Antiplts/Anticoagulants/Anti thrombotics: None  GI Procedures: None  History reviewed. No pertinent past medical history.  Past Surgical History:  Procedure Laterality Date   APPENDECTOMY  03/08/2022   Procedure: APPENDECTOMY;  Surgeon: Jules Husbands, MD;  Location: [facility] ORS;  Service: General;;   COLECTOMY WITH COLOSTOMY CREATION/HARTMANN PROCEDURE N/A 03/08/2022   Procedure: COLECTOMY WITH COLOSTOMY CREATION/HARTMANN PROCEDURE;  Surgeon: Jules Husbands, MD;  Location: [facility] ORS;  Service: General;  Laterality: N/A;   LAPAROTOMY N/A 03/08/2022   Procedure: EXPLORATORY LAPAROTOMY;  Surgeon: Jules Husbands, MD;  Location: [facility] ORS;  Service: General;  Laterality: N/A;     Current Outpatient Medications:     Sod Picosulfate-Mag Ox-Cit Acd (CLENPIQ) 10-3.5-12 MG-GM -GM/175ML SOLN, Take 175 mLs by mouth once for 1 dose., Disp: 350 mL, Rfl: 0   No family history on file.   Social History   Tobacco Use   Smoking status: Former    Packs/day: 0.50    Types: Cigarettes    Passive exposure: Never   Smokeless tobacco: Never  Vaping Use   Vaping Use: Never used  Substance Use Topics   Alcohol use: Yes    Comment: socially   Drug use: Never    Allergies as of 06/06/2022   (No Known Allergies)    Review of Systems:    All systems reviewed and negative except where noted in HPI.   Physical Exam:  BP (!) 160/122 (BP Location: Left Arm, Patient Position: Sitting, Cuff Size: Normal)   Pulse (!) 101   Temp 97.9 F (36.6 C) (Oral)   Ht 6' (1.829 m)   Wt 254 lb 2 oz (115.3 kg)   BMI 34.47 kg/m  No LMP for male patient.  General:   Alert,  Well-developed, well-nourished, pleasant and cooperative in NAD Head:  Normocephalic and atraumatic. Eyes:  Sclera clear, no icterus.   Conjunctiva pink. Ears:  Normal auditory acuity. Nose:  No deformity, discharge, or lesions. Mouth:  No deformity or lesions,oropharynx pink & moist. Neck:  Supple; no masses or thyromegaly. Lungs:  Respirations even and unlabored.  Clear throughout to auscultation.   No wheezes, crackles, or rhonchi. No acute distress. Heart:  Regular rate and rhythm; no murmurs, clicks, rubs, or gallops. Abdomen:  Normal bowel sounds.  Midline vertical scar with scab, healing well, soft, non-tender and non-distended  without masses, hepatosplenomegaly or hernias noted.  No guarding or rebound tenderness, soft ostomy is noted, pink and patent, nontender.   Rectal: Not performed Msk:  Symmetrical without gross deformities. Good, equal movement & strength bilaterally. Pulses:  Normal pulses noted. Extremities:  No clubbing or edema.  No cyanosis. Neurologic:  Alert and oriented x3;  grossly normal neurologically. Skin:  Intact without  significant lesions or rashes. No jaundice. Psych:  Alert and cooperative. Normal mood and affect.  Imaging Studies: Reviewed  Assessment and Plan:   Erik Johns is a 41 y.o. pleasant African-American male with no past medical history, history of perforated sigmoid diverticulitis in end of March 2023 s/p Hartman's procedure is seen in consultation for diagnostic colonoscopy prior to takedown  Proceed with colonoscopy via stoma and examination of Hartman's pouch  I have discussed alternative options, risks & benefits,  which include, but are not limited to, bleeding, infection, perforation,respiratory complication & drug reaction.  The patient agrees with this plan & written consent will be obtained.    Follow up as needed   Erik Darby, MD

## 2022-06-14 ENCOUNTER — Encounter: Payer: Self-pay | Admitting: Gastroenterology

## 2022-06-25 ENCOUNTER — Ambulatory Visit: Payer: Commercial Managed Care - PPO | Admitting: Surgery

## 2022-06-26 ENCOUNTER — Ambulatory Visit: Payer: Commercial Managed Care - PPO | Admitting: Anesthesiology

## 2022-06-26 ENCOUNTER — Other Ambulatory Visit: Payer: Self-pay

## 2022-06-26 ENCOUNTER — Encounter: Payer: Self-pay | Admitting: Gastroenterology

## 2022-06-26 ENCOUNTER — Encounter
Admission: RE | Disposition: A | Discharge status: Home / Self Care | Payer: Self-pay | Source: Ambulatory Visit | Attending: Gastroenterology

## 2022-06-26 ENCOUNTER — Ambulatory Visit
Admission: RE | Admit: 2022-06-26 | Discharge: 2022-06-26 | Disposition: A | Discharge status: Home / Self Care | Payer: Commercial Managed Care - PPO | Source: Ambulatory Visit | Attending: Gastroenterology | Admitting: Gastroenterology

## 2022-06-26 DIAGNOSIS — E669 Obesity, unspecified: Secondary | ICD-10-CM | POA: Insufficient documentation

## 2022-06-26 DIAGNOSIS — Z6832 Body mass index (BMI) 32.0-32.9, adult: Secondary | ICD-10-CM | POA: Diagnosis not present

## 2022-06-26 DIAGNOSIS — K635 Polyp of colon: Secondary | ICD-10-CM | POA: Diagnosis not present

## 2022-06-26 DIAGNOSIS — Z9049 Acquired absence of other specified parts of digestive tract: Secondary | ICD-10-CM | POA: Diagnosis not present

## 2022-06-26 DIAGNOSIS — K5720 Diverticulitis of large intestine with perforation and abscess without bleeding: Secondary | ICD-10-CM | POA: Diagnosis present

## 2022-06-26 DIAGNOSIS — D124 Benign neoplasm of descending colon: Secondary | ICD-10-CM | POA: Insufficient documentation

## 2022-06-26 DIAGNOSIS — F17210 Nicotine dependence, cigarettes, uncomplicated: Secondary | ICD-10-CM | POA: Insufficient documentation

## 2022-06-26 DIAGNOSIS — K621 Rectal polyp: Secondary | ICD-10-CM | POA: Diagnosis not present

## 2022-06-26 DIAGNOSIS — K644 Residual hemorrhoidal skin tags: Secondary | ICD-10-CM | POA: Diagnosis not present

## 2022-06-26 DIAGNOSIS — Z933 Colostomy status: Secondary | ICD-10-CM | POA: Insufficient documentation

## 2022-06-26 HISTORY — PX: POLYPECTOMY: SHX5525

## 2022-06-26 HISTORY — DX: Other specified health status: Z78.9

## 2022-06-26 HISTORY — PX: COLONOSCOPY WITH PROPOFOL: SHX5780

## 2022-06-26 SURGERY — COLONOSCOPY WITH PROPOFOL
Anesthesia: General | Site: Rectum

## 2022-06-26 MED ORDER — ONDANSETRON HCL 4 MG/2ML IJ SOLN: 4.0000 mg | Freq: Once | INTRAMUSCULAR | Status: DC | PRN

## 2022-06-26 MED ORDER — LIDOCAINE HCL (CARDIAC) PF 100 MG/5ML IV SOSY
PREFILLED_SYRINGE | INTRAVENOUS | Status: DC | PRN
  Administered 2022-06-26: 40 mg via INTRAVENOUS

## 2022-06-26 MED ORDER — ACETAMINOPHEN 325 MG PO TABS: 650.0000 mg | ORAL_TABLET | ORAL | Status: DC | PRN

## 2022-06-26 MED ORDER — LABETALOL HCL 5 MG/ML IV SOLN
INTRAVENOUS | Status: DC | PRN
  Administered 2022-06-26 (×2): 10 mg via INTRAVENOUS

## 2022-06-26 MED ORDER — PROPOFOL 10 MG/ML IV BOLUS
INTRAVENOUS | Status: DC | PRN
  Administered 2022-06-26: 50 mg via INTRAVENOUS
  Administered 2022-06-26: 30 mg via INTRAVENOUS
  Administered 2022-06-26: 50 mg via INTRAVENOUS
  Administered 2022-06-26: 40 mg via INTRAVENOUS
  Administered 2022-06-26 (×2): 50 mg via INTRAVENOUS
  Administered 2022-06-26 (×2): 30 mg via INTRAVENOUS
  Administered 2022-06-26: 50 mg via INTRAVENOUS
  Administered 2022-06-26: 60 mg via INTRAVENOUS
  Administered 2022-06-26 (×2): 50 mg via INTRAVENOUS

## 2022-06-26 MED ORDER — SODIUM CHLORIDE 0.9 % IV SOLN: INTRAVENOUS | Status: DC

## 2022-06-26 MED ORDER — STERILE WATER FOR IRRIGATION IR SOLN
Status: DC | PRN
  Administered 2022-06-26: 60 mL

## 2022-06-26 MED ORDER — STERILE WATER FOR IRRIGATION IR SOLN
Status: DC | PRN
  Administered 2022-06-26: 1

## 2022-06-26 MED ORDER — ACETAMINOPHEN 160 MG/5ML PO SOLN: 325.0000 mg | ORAL | Status: DC | PRN

## 2022-06-26 MED ORDER — LACTATED RINGERS IV SOLN: INTRAVENOUS | Status: DC

## 2022-06-26 SURGICAL SUPPLY — 26 items
CLIP HMST 235XBRD CATH ROT (MISCELLANEOUS) IMPLANT
CLIP RESOLUTION 360 11X235 (MISCELLANEOUS)
ELECT REM PT RETURN 9FT ADLT (ELECTROSURGICAL)
ELECTRODE REM PT RTRN 9FT ADLT (ELECTROSURGICAL) IMPLANT
FCP ESCP3.2XJMB 240X2.8X (MISCELLANEOUS)
FORCEPS BIOP RAD 4 LRG CAP 4 (CUTTING FORCEPS) IMPLANT
FORCEPS BIOP RJ4 240 W/NDL (MISCELLANEOUS)
FORCEPS ESCP3.2XJMB 240X2.8X (MISCELLANEOUS) IMPLANT
GOWN CVR UNV OPN BCK APRN NK (MISCELLANEOUS) ×2 IMPLANT
GOWN ISOL THUMB LOOP REG UNIV (MISCELLANEOUS) ×4
INJECTOR VARIJECT VIN23 (MISCELLANEOUS) IMPLANT
KIT DEFENDO VALVE AND CONN (KITS) IMPLANT
KIT PRC NS LF DISP ENDO (KITS) ×1 IMPLANT
KIT PROCEDURE OLYMPUS (KITS) ×2
MANIFOLD NEPTUNE II (INSTRUMENTS) ×2 IMPLANT
MARKER SPOT ENDO TATTOO 5ML (MISCELLANEOUS) IMPLANT
PROBE APC STR FIRE (PROBE) IMPLANT
RETRIEVER NET ROTH 2.5X230 LF (MISCELLANEOUS) IMPLANT
SNARE COLD EXACTO (MISCELLANEOUS) ×1 IMPLANT
SNARE SHORT THROW 13M SML OVAL (MISCELLANEOUS) IMPLANT
SNARE SHORT THROW 30M LRG OVAL (MISCELLANEOUS) IMPLANT
SNARE SNG USE RND 15MM (INSTRUMENTS) IMPLANT
SPOT EX ENDOSCOPIC TATTOO (MISCELLANEOUS)
TRAP ETRAP POLY (MISCELLANEOUS) ×1 IMPLANT
VARIJECT INJECTOR VIN23 (MISCELLANEOUS)
WATER STERILE IRR 250ML POUR (IV SOLUTION) ×2 IMPLANT

## 2022-06-26 NOTE — Anesthesia Postprocedure Evaluation (Signed)
Anesthesia Post Note  Patient: Erik Johns  Procedure(s) Performed: COLONOSCOPY WITH PROPOFOL (Rectum) POLYPECTOMY (Rectum)     Patient location during evaluation: PACU Anesthesia Type: General Level of consciousness: awake and alert Pain management: pain level controlled Vital Signs Assessment: post-procedure vital signs reviewed and stable Respiratory status: spontaneous breathing, nonlabored ventilation, respiratory function stable and patient connected to nasal cannula oxygen Cardiovascular status: blood pressure returned to baseline and stable Postop Assessment: no apparent nausea or vomiting Anesthetic complications: no   No notable events documented.  Velva Molinari A  Draven Laine

## 2022-06-26 NOTE — Op Note (Signed)
[facility] Gastroenterology Patient Name: Erik Johns Procedure Date: 06/26/2022 9:31 AM MRN: [id number] Account #: [id number] Date of Birth: [birth year] Admit Type: Outpatient Age: 41 Room: [facility] OR ROOM 01 Gender: Male Note Status: Finalized Instrument Name: [id number] Procedure:             Colonoscopy via Stoma with Endoscopy of Hartmann Pouch Indications:           History of diverticulitis s/p diverting colostomy with                         Jeanette Caprice pouch, Preoperative assessment for colostomy                         take-down, Preoperative assessment for reversal of                         Hartmann pouch Providers:             Lin Landsman MD, MD Referring MD:          Hewitt (Referring MD) Medicines:             General Anesthesia Complications:         No immediate complications. Estimated blood loss: None. Procedure:             Pre-Anesthesia Assessment:                        - Prior to the procedure, a History and Physical was                         performed, and patient medications and allergies were                         reviewed. The patient is competent. The risks and                         benefits of the procedure and the sedation options and                         risks were discussed with the patient. All questions                         were answered and informed consent was obtained.                         Patient identification and proposed procedure were                         verified by the physician, the nurse, the                         anesthesiologist, the anesthetist and the technician                         in the pre-procedure area in the procedure room in the                         endoscopy suite. Mental Status Examination: alert and  oriented. Airway Examination: normal oropharyngeal                         airway and neck mobility. Respiratory Examination:                          clear to auscultation. CV Examination: normal.                         Prophylactic Antibiotics: The patient does not require                         prophylactic antibiotics. Prior Anticoagulants: The                         patient has taken no previous anticoagulant or                         antiplatelet agents. ASA Grade Assessment: II - A                         patient with mild systemic disease. After reviewing                         the risks and benefits, the patient was deemed in                         satisfactory condition to undergo the procedure. The                         anesthesia plan was to use general anesthesia.                         Immediately prior to administration of medications,                         the patient was re-assessed for adequacy to receive                         sedatives. The heart rate, respiratory rate, oxygen                         saturations, blood pressure, adequacy of pulmonary                         ventilation, and response to care were monitored                         throughout the procedure. The physical status of the                         patient was re-assessed after the procedure.                        After obtaining informed consent, the endoscope was                         passed under direct vision. Throughout the procedure,  the patient's blood pressure, pulse, and oxygen                         saturations were monitored continuously. The                         Colonoscope was introduced through the anus and                         advanced to the the terminal ileum, with                         identification of the appendiceal orifice and IC                         valve. The procedure was performed without difficulty.                         The patient tolerated the procedure well. The quality                         of the bowel preparation was fair. Findings:      Patient is  status-post sigmoid colectomy with an end sigmoid colostomy.      The perianal and digital rectal examinations were normal. Pertinent       negatives include normal sphincter tone and no palpable rectal lesions.      The terminal ileum appeared normal.      Two sessile polyps were found in the descending colon and cecum. The       polyps were 3 to 4 mm in size. These polyps were removed with a cold       snare. Resection and retrieval were complete. Estimated blood loss: none.      Two sessile polyps were found in the rectum. The polyps were 4 mm in       size. These polyps were removed with a cold snare. Resection and       retrieval were complete.      Non-bleeding external hemorrhoids were found during retroflexion. The       hemorrhoids were medium-sized and large.      There was a [employer], with the terminus (surgical end) 20 cm from       the anal verge. This was characterized by healthy appearing mucosa. Impression:            - Preparation of the colon was fair.                        - The examined portion of the ileum was normal.                        - Two 3 to 4 mm polyps in the descending colon and in                         the cecum, removed with a cold snare. Resected and                         retrieved.                        -  Two 4 mm polyps in the rectum, removed with a cold                         snare. Resected and retrieved.                        - Non-bleeding external hemorrhoids.                        - Hartmann pouch with healthy appearing mucosa seen. Recommendation:        - Discharge patient to home (with escort).                        - Await pathology results.                        - Resume previous diet today.                        - Return to referring physician as previously                         scheduled.                        - Repeat post-surgical lower GI endoscopy in 5 years                         for surveillance based on  pathology results. Procedure Code(s):     --- Professional ---                        [phone], Colonoscopy through stoma; with removal of                         tumor(s), polyp(s), or other lesion(s) by snare                         technique                        [phone], Colonoscopy, flexible; diagnostic, including                         collection of specimen(s) by brushing or washing, when                         performed (separate procedure) Diagnosis Code(s):     --- Professional ---                        K64.4, Residual hemorrhoidal skin tags                        K63.5, Polyp of colon                        K62.1, Rectal polyp                        Z93.3, Colostomy status  Z90.49, Acquired absence of other specified parts of                         digestive tract                        Z01.818, Encounter for other preprocedural examination CPT copyright 2019 American Medical Association. All rights reserved. The codes documented in this report are preliminary and upon coder review may  be revised to meet current compliance requirements. Dr. Ulyess Mort Lin Landsman MD, MD 06/26/2022 10:19:22 AM This report has been signed electronically. Number of Addenda: 0 Note Initiated On: 06/26/2022 9:31 AM Scope Withdrawal Time: 0 hours 21 minutes 16 seconds  Total Procedure Duration: 0 hours 24 minutes 44 seconds  Estimated Blood Loss:  Estimated blood loss: none.      [facility]

## 2022-06-26 NOTE — Anesthesia Preprocedure Evaluation (Signed)
Anesthesia Evaluation  Patient identified by MRN, date of birth, ID band Patient awake    Reviewed: Allergy & Precautions, NPO status , Patient's Chart, lab work & pertinent test results, reviewed documented beta blocker date and time   History of Anesthesia Complications Negative for: history of anesthetic complications  Airway Mallampati: III  TM Distance: >3 FB Neck ROM: Full    Dental   Pulmonary Current Smoker and Patient abstained from smoking.,    breath sounds clear to auscultation       Cardiovascular (-) angina(-) DOE  Rhythm:Regular Rate:Normal     Neuro/Psych    GI/Hepatic neg GERD  , Diverticulosis   Endo/Other    Renal/GU      Musculoskeletal   Abdominal (+) + obese (BMI 33),   Peds  Hematology   Anesthesia Other Findings   Reproductive/Obstetrics                             Anesthesia Physical Anesthesia Plan  ASA: 2  Anesthesia Plan: General   Post-op Pain Management:    Induction: Intravenous  PONV Risk Score and Plan: 1 and Propofol infusion, TIVA and Treatment may vary due to age or medical condition  Airway Management Planned: Natural Airway and Nasal Cannula  Additional Equipment:   Intra-op Plan:   Post-operative Plan:   Informed Consent: I have reviewed the patients History and Physical, chart, labs and discussed the procedure including the risks, benefits and alternatives for the proposed anesthesia with the patient or authorized representative who has indicated his/her understanding and acceptance.       Plan Discussed with: CRNA and Anesthesiologist  Anesthesia Plan Comments:         Anesthesia Quick Evaluation

## 2022-06-26 NOTE — H&P (Signed)
Cephas Darby, MD [address]  [place]  [place], [place] [address]  Main: [phone]  Fax: [phone] Pager: [phone]  Primary Care Physician:  [facility] Primary Gastroenterologist:  Dr. Cephas Darby  Pre-Procedure History & Physical: HPI:  Erik Johns is a 41 y.o. male is here for an colonoscopy.   Past Medical History:  Diagnosis Date   Medical history non-contributory     Past Surgical History:  Procedure Laterality Date   APPENDECTOMY  03/08/2022   Procedure: APPENDECTOMY;  Surgeon: Jules Husbands, MD;  Location: [facility];  Service: General;;   COLECTOMY WITH COLOSTOMY CREATION/HARTMANN PROCEDURE N/A 03/08/2022   Procedure: COLECTOMY WITH COLOSTOMY CREATION/HARTMANN PROCEDURE;  Surgeon: Jules Husbands, MD;  Location: [facility];  Service: General;  Laterality: N/A;   LAPAROTOMY N/A 03/08/2022   Procedure: EXPLORATORY LAPAROTOMY;  Surgeon: Jules Husbands, MD;  Location: [facility];  Service: General;  Laterality: N/A;    Prior to Admission medications   Not on File    Allergies as of 06/06/2022   (No Known Allergies)    History reviewed. No pertinent family history.  Social History   Socioeconomic History   Marital status: Single    Spouse name: Not on file   Number of children: Not on file   Years of education: Not on file   Highest education level: Not on file  Occupational History   Not on file  Tobacco Use   Smoking status: Every Day    Packs/day: 0.50    Years: 20.00    Total pack years: 10.00    Types: Cigarettes    Passive exposure: Never   Smokeless tobacco: Never  Vaping Use   Vaping Use: Never used  Substance and Sexual Activity   Alcohol use: Yes    Alcohol/week: 12.0 standard drinks of alcohol    Types: 12 Standard drinks or equivalent per week    Comment: socially- weekends   Drug use: Never   Sexual activity: Not on file  Other Topics Concern   Not on file  Social History Narrative   Not on file    Social Determinants of Health   Financial Resource Strain: Not on file  Food Insecurity: Not on file  Transportation Needs: Not on file  Physical Activity: Not on file  Stress: Not on file  Social Connections: Not on file  Intimate Partner Violence: Not on file    Review of Systems: See HPI, otherwise negative ROS  Physical Exam: BP (!) 158/98   Pulse 78   Temp 98.4 F (36.9 C) (Temporal)   Ht 6' (1.829 m)   Wt 110.2 kg   SpO2 98%   BMI 32.96 kg/m  General:   Alert,  pleasant and cooperative in NAD Head:  Normocephalic and atraumatic. Neck:  Supple; no masses or thyromegaly. Lungs:  Clear throughout to auscultation.    Heart:  Regular rate and rhythm. Abdomen:  Soft, nontender and nondistended. Normal bowel sounds, without guarding, and without rebound.   Neurologic:  Alert and  oriented x4;  grossly normal neurologically.  Impression/Plan: Erik Johns is here for an colonoscopy to be performed for history of perforated sigmoid diverticulitis in end of March 2023 s/p Hartman's procedure is seen in consultation for diagnostic colonoscopy prior to takedown  Risks, benefits, limitations, and alternatives regarding  colonoscopy have been reviewed with the patient.  Questions have been answered.  All parties agreeable.   Sherri Sear, MD  06/26/2022, 8:53  AM

## 2022-06-26 NOTE — Transfer of Care (Signed)
Immediate Anesthesia Transfer of Care Note  Patient: Erik Johns  Procedure(s) Performed: COLONOSCOPY WITH PROPOFOL (Rectum) POLYPECTOMY (Rectum)  Patient Location: PACU  Anesthesia Type: General  Level of Consciousness: awake, alert  and patient cooperative  Airway and Oxygen Therapy: Patient Spontanous Breathing and Patient connected to supplemental oxygen  Post-op Assessment: Post-op Vital signs reviewed, Patient's Cardiovascular Status Stable, Respiratory Function Stable, Patent Airway and No signs of Nausea or vomiting  Post-op Vital Signs: Reviewed and stable  Complications: No notable events documented.

## 2022-06-27 ENCOUNTER — Encounter: Payer: Self-pay | Admitting: Gastroenterology

## 2022-06-28 LAB — SURGICAL PATHOLOGY

## 2022-06-29 ENCOUNTER — Encounter: Payer: Self-pay | Admitting: Gastroenterology

## 2022-07-02 ENCOUNTER — Encounter: Payer: Self-pay | Admitting: Surgery

## 2022-07-02 ENCOUNTER — Ambulatory Visit (INDEPENDENT_AMBULATORY_CARE_PROVIDER_SITE_OTHER): Payer: Commercial Managed Care - PPO | Admitting: Surgery

## 2022-07-02 VITALS — BP 159/98 | HR 86 | Temp 99.0°F | Wt 248.4 lb

## 2022-07-02 DIAGNOSIS — K5720 Diverticulitis of large intestine with perforation and abscess without bleeding: Secondary | ICD-10-CM | POA: Diagnosis not present

## 2022-07-02 NOTE — Patient Instructions (Addendum)
Your CT is scheduled for 07/06/2022 at 3:30 pm (arrive by 3:15 pm) @ [facility] [address] [place]. Nothing to eat or drink 4 hours prior. Please pick up contrast between now and the day before.    Please get your labs drawn. You do not need an appointment.   If you have any concerns or questions, please feel free to call our office. See follow up appointment below.    Colostomy Reversal Surgery  A colostomy reversal is a surgical procedure that is done to reverse a colostomy. In this reversal procedure, the large intestine is disconnected from the opening in the abdomen (stoma). Then, the changes that were made to the intestine during the colostomy will be reversed to restore the flow of stool through the entire intestine. Depending on the type of colostomy being reversed, this may involve one of the following: Reconnecting the two ends of the intestine that were separated during colostomy surgery. Closing the opening that was made in the side of the intestine to allow stool to be redirected through the stoma. After this surgery, a stoma and colostomy bag are no longer needed. Stool (feces) can leave your body through the rectum, as it did before you had a colostomy. Tell a health care provider about: Any allergies you have. All medicines you are taking, including vitamins, herbs, eye drops, creams, and over-the-counter medicines. Any problems you or family members have had with anesthetic medicines. Any bleeding problems you have. Any surgeries you have had. Any medical conditions you have. Whether you are pregnant or may be pregnant. What are the risks? Generally, this is a safe procedure. However, problems may occur, including: Infection. Bleeding. Allergic reactions to medicines. Damage to nearby structures or organs. A temporary condition in which the intestines stop moving and working correctly (ileus). This usually goes away in 3-7 days. A collection of pus (abscess) in the  abdomen or pelvis. Intestinal blockage. Leaking at the area of the intestine where it was reconnected (anastomotic leak) or where the opening of the stoma was closed. Narrowing of the intestine (stricture) at the place where it was reconnected. Urinary and sexual dysfunction. What happens before the procedure? When to stop eating and drinking Follow instructions from your health care provider about what you may eat and drink before your procedure. These may include: 8 hours before your procedure Stop eating most foods. Do not eat meat, fried foods, or fatty foods. Eat only light foods, such as toast or crackers. All liquids are okay except energy drinks and alcohol. 6 hours before your procedure Stop eating. Drink only clear liquids, such as water, clear fruit juice, black coffee, plain tea, and sports drinks. Do not drink energy drinks or alcohol. 2 hours before your procedure Stop drinking all liquids. You may be allowed to take medicines with small sips of water. If you do not follow your health care provider's instructions, your procedure may be delayed or canceled. Medicines Ask your health care provider about: Changing or stopping your regular medicines. This is especially important if you are taking diabetes medicines or blood thinners. Taking medicines such as aspirin and ibuprofen. These medicines can thin your blood. Do not take these medicines unless your health care provider tells you to take them. Taking over-the-counter medicines, vitamins, herbs, and supplements. General instructions You may have an exam or testing. Do not use any products that contain nicotine or tobacco before the procedure. These products include cigarettes, chewing tobacco, and vaping devices, such as e-cigarettes. If you  need help quitting, ask your health care provider. Ask your health care provider what steps will be taken to help prevent infection. These may include: Removing hair at the surgery  site. Washing skin with a germ-killing soap. Taking antibiotic medicine. What happens during the procedure?  An IV will be inserted into one of your veins. You may be given: A medicine to help you relax (sedative). A medicine to make you fall asleep (general anesthetic). An incision will be made in your abdomen at the site of the stoma. The large intestine will be disconnected from the abdomen at the site of the stoma. The next steps will vary depending on the type of colostomy reversal surgery you are having. There are two main types: End colostomy reversal. The surgeon will use stitches (sutures) or staples to reconnect the two ends of the intestine that were separated during the end colostomy. Loop colostomy reversal. The surgeon will use sutures or staples to close the opening in the intestine that had been allowing stool to be redirected through the stoma. The intestine will then be put back into its normal position inside the abdomen. The incision will be closed with sutures, skin glue, or adhesive strips. It may be covered with bandages (dressings). The procedure may vary among health care providers and hospitals. What happens after the procedure? Your blood pressure, heart rate, breathing rate, and blood oxygen level will be monitored until you leave the hospital or clinic. You will be given pain medicine as needed. You will slowly increase your diet and movement as told by your health care provider. Summary A colostomy reversal is a surgical procedure that is done to reverse a colostomy. After this surgery, stool (feces) can leave your body through the rectum, as it did before you had a colostomy. Before the procedure, follow instructions from your health care provider about taking medicines and about eating and drinking. During the procedure, your colostomy will be reversed, and the incision will be closed with sutures, skin glue, or adhesive strips. It may be covered with bandages  (dressings). After the procedure, you will slowly increase your diet and movement as told by your health care provider. This information is not intended to replace advice given to you by your health care provider. Make sure you discuss any questions you have with your health care provider. Document Revised: 07/28/2021 Document Reviewed: 07/28/2021 Elsevier Patient Education  [place].

## 2022-07-03 ENCOUNTER — Telehealth: Payer: Self-pay

## 2022-07-03 NOTE — Telephone Encounter (Signed)
We received FMAL forms for intermittent FMAL for the appointments all related to his surgery. Please advised if this can be done  Patient was seen on 06/06/2022 and had colonoscopy on 06/26/2022. No follow up is scheduled at this time

## 2022-07-03 NOTE — Telephone Encounter (Signed)
Faxed FMAL for the days he was seen at our office

## 2022-07-03 NOTE — Progress Notes (Unsigned)
Outpatient Surgical Follow Up  07/03/2022  Erik Johns is an 41 y.o. male.   Chief Complaint  Patient presents with   Follow-up    Colectomy w/colostomy    HPI:41 year old male history of complicated diverticulitis with peritonitis that required an emergent Hartmann procedure 4 months ago by me.  he is doing very well, taking p.o. and colostomy is working well.  Completed a colonoscopy showing no major issues, images were personally reviewed.  He is walking and he is back to his normal self.  Denies any fevers any chills.  Past Medical History:  Diagnosis Date   Medical history non-contributory     Past Surgical History:  Procedure Laterality Date   APPENDECTOMY  03/08/2022   Procedure: APPENDECTOMY;  Surgeon: Jules Husbands, MD;  Location: [facility];  Service: General;;   COLECTOMY WITH COLOSTOMY CREATION/HARTMANN PROCEDURE N/A 03/08/2022   Procedure: COLECTOMY WITH COLOSTOMY CREATION/HARTMANN PROCEDURE;  Surgeon: Jules Husbands, MD;  Location: [facility];  Service: General;  Laterality: N/A;   COLONOSCOPY WITH PROPOFOL N/A 06/26/2022   Procedure: COLONOSCOPY WITH PROPOFOL;  Surgeon: Lin Landsman, MD;  Location: [facility];  Service: Endoscopy;  Laterality: N/A;   LAPAROTOMY N/A 03/08/2022   Procedure: EXPLORATORY LAPAROTOMY;  Surgeon: Jules Husbands, MD;  Location: [facility];  Service: General;  Laterality: N/A;   POLYPECTOMY N/A 06/26/2022   Procedure: POLYPECTOMY;  Surgeon: Lin Landsman, MD;  Location: [facility];  Service: Endoscopy;  Laterality: N/A;    History reviewed. No pertinent family history.  Social History:  reports that he has been smoking cigarettes. He has a 10.00 pack-year smoking history. He has never been exposed to tobacco smoke. He has never used smokeless tobacco. He reports current alcohol use of about 12.0 standard drinks of alcohol per week. He reports that he does not use drugs.  Allergies: No Known Allergies  Medications  reviewed.    ROS Full ROS performed and is otherwise negative other than what is stated in HPI   BP (!) 159/98   Pulse 86   Temp 99 F (37.2 C) (Oral)   Wt 248 lb 6.4 oz (112.7 kg)   SpO2 97%   BMI 33.69 kg/m   Physical Exam Vitals and nursing note reviewed. Exam conducted with a chaperone present.  Constitutional:      General: He is not in acute distress.    Appearance: Normal appearance. He is not ill-appearing.  Cardiovascular:     Rate and Rhythm: Normal rate and regular rhythm.     Heart sounds: No murmur heard. Pulmonary:     Effort: Pulmonary effort is normal. No respiratory distress.     Breath sounds: Normal breath sounds. No stridor. No wheezing or rhonchi.  Abdominal:     General: Abdomen is flat. There is no distension.     Palpations: Abdomen is soft. There is no mass.     Tenderness: There is no abdominal tenderness. There is no guarding or rebound.     Hernia: No hernia is present.  Musculoskeletal:        General: No swelling or tenderness. Normal range of motion.     Cervical back: Normal range of motion and neck supple. No rigidity or tenderness.  Skin:    General: Skin is warm and dry.     Capillary Refill: Capillary refill takes less than 2 seconds.     Coloration: Skin is not jaundiced.  Neurological:     General: No focal deficit  present.     Mental Status: He is alert.  Psychiatric:        Mood and Affect: Mood normal.      Assessment/Plan: 41 year old male status post Hartman's interested in colostomy reversal.  We will obtain a CT scan given the severe inflammatory response from a few months ago.  This will help [place] navigate appropriate timing.  We will also be able to assess for any potential undrained collections or other pathology that will need to be addressed during the same operative setting I spent 30 minutes in this encounter including coordination of his care, personally reviewing imaging studies, counseling the patient, placing  orders and performing appropriate documentation   Caroleen Hamman, MD [place] Surgeon

## 2022-07-04 ENCOUNTER — Encounter: Payer: Self-pay | Admitting: Surgery

## 2022-07-06 ENCOUNTER — Ambulatory Visit
Admission: RE | Admit: 2022-07-06 | Discharge: 2022-07-06 | Disposition: A | Discharge status: Home / Self Care | Payer: Commercial Managed Care - PPO | Source: Ambulatory Visit | Attending: Surgery | Admitting: Surgery

## 2022-07-06 DIAGNOSIS — K5720 Diverticulitis of large intestine with perforation and abscess without bleeding: Secondary | ICD-10-CM | POA: Insufficient documentation

## 2022-07-06 MED ORDER — IOHEXOL 300 MG/ML  SOLN
100.0000 mL | Freq: Once | INTRAMUSCULAR | Status: AC | PRN
Start: 2022-07-06 — End: 2022-07-06
  Administered 2022-07-06: 100 mL via INTRAVENOUS

## 2022-07-16 ENCOUNTER — Telehealth: Payer: Commercial Managed Care - PPO | Admitting: Surgery

## 2022-07-16 ENCOUNTER — Telehealth: Payer: Self-pay | Admitting: *Deleted

## 2022-07-16 NOTE — Telephone Encounter (Signed)
Faxed FMLA to [facility] Team at [phone]

## 2022-07-25 ENCOUNTER — Other Ambulatory Visit: Payer: Self-pay

## 2022-07-25 ENCOUNTER — Encounter: Payer: Self-pay | Admitting: Surgery

## 2022-07-25 ENCOUNTER — Ambulatory Visit (INDEPENDENT_AMBULATORY_CARE_PROVIDER_SITE_OTHER): Payer: Commercial Managed Care - PPO | Admitting: Surgery

## 2022-07-25 VITALS — BP 156/108 | HR 92 | Temp 99.0°F | Ht 72.0 in | Wt 256.8 lb

## 2022-07-25 DIAGNOSIS — K5732 Diverticulitis of large intestine without perforation or abscess without bleeding: Secondary | ICD-10-CM

## 2022-07-25 DIAGNOSIS — Z933 Colostomy status: Secondary | ICD-10-CM | POA: Diagnosis not present

## 2022-07-25 DIAGNOSIS — K5720 Diverticulitis of large intestine with perforation and abscess without bleeding: Secondary | ICD-10-CM

## 2022-07-25 MED ORDER — NEOMYCIN SULFATE 500 MG PO TABS: 1000.0000 mg | ORAL_TABLET | Freq: Three times a day (TID) | ORAL | 0 refills | Status: DC

## 2022-07-25 MED ORDER — METRONIDAZOLE 500 MG PO TABS: ORAL_TABLET | ORAL | 0 refills | Status: DC

## 2022-07-25 MED ORDER — POLYETHYLENE GLYCOL 3350 17 GM/SCOOP PO POWD: 1.0000 | Freq: Once | ORAL | 0 refills | Status: AC

## 2022-07-25 MED ORDER — BISACODYL 5 MG PO TBEC: DELAYED_RELEASE_TABLET | ORAL | 0 refills | Status: DC

## 2022-07-25 NOTE — H&P (View-Only) (Signed)
Outpatient Surgical Follow Up  07/25/2022  Erik Johns is an 41 y.o. male.   Chief Complaint  Patient presents with   Follow-up    Colostomy -discuss reversal    HPI: 41 year old male history of complicated diverticulitis with peritonitis that required an emergent Hartmann procedure 4 1/2 months ago by me.  he is doing very well, taking p.o. and colostomy is working well.  Completed a colonoscopy showing no major issues, images were personally reviewed.  He is walking and he is back to his normal self.  Denies any fevers any chills. Repeat CT scan shows no active infection or abscess.  He has been doing very well and his wounds have completely closed.  He is interested in colostomy reversal and wishes if at all possible to have a minimally invasive approach.  Past Medical History:  Diagnosis Date   Medical history non-contributory     Past Surgical History:  Procedure Laterality Date   APPENDECTOMY  03/08/2022   Procedure: APPENDECTOMY;  Surgeon: Jules Husbands, MD;  Location: [facility];  Service: General;;   COLECTOMY WITH COLOSTOMY CREATION/HARTMANN PROCEDURE N/A 03/08/2022   Procedure: COLECTOMY WITH COLOSTOMY CREATION/HARTMANN PROCEDURE;  Surgeon: Jules Husbands, MD;  Location: [facility];  Service: General;  Laterality: N/A;   COLONOSCOPY WITH PROPOFOL N/A 06/26/2022   Procedure: COLONOSCOPY WITH PROPOFOL;  Surgeon: Lin Landsman, MD;  Location: [facility];  Service: Endoscopy;  Laterality: N/A;   LAPAROTOMY N/A 03/08/2022   Procedure: EXPLORATORY LAPAROTOMY;  Surgeon: Jules Husbands, MD;  Location: [facility];  Service: General;  Laterality: N/A;   POLYPECTOMY N/A 06/26/2022   Procedure: POLYPECTOMY;  Surgeon: Lin Landsman, MD;  Location: [facility];  Service: Endoscopy;  Laterality: N/A;    History reviewed. No pertinent family history.  Social History:  reports that he has been smoking cigarettes. He has a 10.00 pack-year smoking history. He has never  been exposed to tobacco smoke. He has never used smokeless tobacco. He reports current alcohol use of about 12.0 standard drinks of alcohol per week. He reports that he does not use drugs.  Allergies: No Known Allergies  Medications reviewed.    ROS Full ROS performed and is otherwise negative other than what is stated in HPI   BP (!) 156/108   Pulse 92   Temp 99 F (37.2 C) (Oral)   Ht 6' (1.829 m)   Wt 256 lb 12.8 oz (116.5 kg)   SpO2 99%   BMI 34.83 kg/m   Physical Exam Vitals and nursing note reviewed. Exam conducted with a chaperone present.  Constitutional:      General: He is not in acute distress.    Appearance: Normal appearance. He is not ill-appearing.  Cardiovascular:     Rate and Rhythm: Normal rate and regular rhythm.     Heart sounds: No murmur heard. Pulmonary:     Effort: Pulmonary effort is normal. No respiratory distress.     Breath sounds: Normal breath sounds. No stridor. No wheezing or rhonchi.  Abdominal:     General: Abdomen is flat. There is no distension.     Palpations: Abdomen is soft. There is no mass.     Tenderness: There is no abdominal tenderness.  He does have a left lower quadrant ostomy that is functioning well.  He had a prior midline laparotomy scar without any open wounds Musculoskeletal:        General: No swelling or tenderness. Normal range of motion.  Cervical back: Normal range of motion and neck supple. No rigidity or tenderness.  Skin:    General: Skin is warm and dry.     Capillary Refill: Capillary refill takes less than 2 seconds.     Coloration: Skin is not jaundiced.  Neurological:     General: No focal deficit present.     Mental Status: He is alert.  Psychiatric:        Mood and Affect: Mood normal.      Assessment/Plan: 41 year old male with prior Hartman's for fecal peritonitis.  I had a lengthy and honest discussion with him regarding colostomy reversal.  Although is not necessarily impossible to perform a  minimally invasive approach it is definitely more difficult in patients with prior history of fecal peritonitis.  He wishes if at all possible to have a minimally invasive approach and I will be willing to try a hand-assisted approach but he understand that we have a low threshold for open intervention if this in fact is the safest procedure to perform depending on surgical findings.  Procedure discussed with patient in detail.  Risks, benefits and possible complications including but not limited to: Bleeding, infection, anastomotic leak, requiring another ostomy, pulmonary and cardiovascular complications.  He understands and wished to proceed.  We will schedule him for next month.  He smokes only on the weekends and encouraged complete smoking cessation which he has done in the past. I spent 40 minutes in this encounter including coordination of his care, personally reviewing imaging studies, counseling the patient, placing orders and performing appropriate documentation   Caroleen Hamman, MD [facility] General Surgeon

## 2022-07-25 NOTE — Progress Notes (Signed)
Outpatient Surgical Follow Up  07/25/2022  Erik Johns is an 41 y.o. male.   Chief Complaint  Patient presents with  . Follow-up    Colostomy -discuss reversal    HPI: 41 year old male history of complicated diverticulitis with peritonitis that required an emergent Hartmann procedure 4 1/2 months ago by me.  he is doing very well, taking p.o. and colostomy is working well.  Completed a colonoscopy showing no major issues, images were personally reviewed.  He is walking and he is back to his normal self.  Denies any fevers any chills. Repeat CT scan shows no active infection or abscess.  Past Medical History:  Diagnosis Date  . Medical history non-contributory     Past Surgical History:  Procedure Laterality Date  . APPENDECTOMY  03/08/2022   Procedure: APPENDECTOMY;  Surgeon: Leafy Ro, MD;  Location: [facility];  Service: General;;  . COLECTOMY WITH COLOSTOMY CREATION/HARTMANN PROCEDURE N/A 03/08/2022   Procedure: COLECTOMY WITH COLOSTOMY CREATION/HARTMANN PROCEDURE;  Surgeon: Leafy Ro, MD;  Location: [facility];  Service: General;  Laterality: N/A;  . COLONOSCOPY WITH PROPOFOL N/A 06/26/2022   Procedure: COLONOSCOPY WITH PROPOFOL;  Surgeon: Toney Reil, MD;  Location: [facility];  Service: Endoscopy;  Laterality: N/A;  . LAPAROTOMY N/A 03/08/2022   Procedure: EXPLORATORY LAPAROTOMY;  Surgeon: Leafy Ro, MD;  Location: [facility];  Service: General;  Laterality: N/A;  . POLYPECTOMY N/A 06/26/2022   Procedure: POLYPECTOMY;  Surgeon: Toney Reil, MD;  Location: [facility];  Service: Endoscopy;  Laterality: N/A;    History reviewed. No pertinent family history.  Social History:  reports that he has been smoking cigarettes. He has a 10.00 pack-year smoking history. He has never been exposed to tobacco smoke. He has never used smokeless tobacco. He reports current alcohol use of about 12.0 standard drinks of alcohol per week. He reports that he  does not use drugs.  Allergies: No Known Allergies  Medications reviewed.    ROS Full ROS performed and is otherwise negative other than what is stated in HPI   BP (!) 156/108   Pulse 92   Temp 99 F (37.2 C) (Oral)   Ht 6' (1.829 m)   Wt 256 lb 12.8 oz (116.5 kg)   SpO2 99%   BMI 34.83 kg/m   Physical Exam Vitals and nursing note reviewed. Exam conducted with a chaperone present.  Constitutional:      General: He is not in acute distress.    Appearance: Normal appearance. He is not ill-appearing.  Cardiovascular:     Rate and Rhythm: Normal rate and regular rhythm.     Heart sounds: No murmur heard. Pulmonary:     Effort: Pulmonary effort is normal. No respiratory distress.     Breath sounds: Normal breath sounds. No stridor. No wheezing or rhonchi.  Abdominal:     General: Abdomen is flat. There is no distension.     Palpations: Abdomen is soft. There is no mass.     Tenderness: There is no abdominal tenderness. There is no guarding or rebound.     Hernia: No hernia is present.  Musculoskeletal:        General: No swelling or tenderness. Normal range of motion.     Cervical back: Normal range of motion and neck supple. No rigidity or tenderness.  Skin:    General: Skin is warm and dry.     Capillary Refill: Capillary refill takes less than 2 seconds.  Coloration: Skin is not jaundiced.  Neurological:     General: No focal deficit present.     Mental Status: He is alert.  Psychiatric:        Mood and Affect: Mood normal.      Assessment/Plan:  I spent 40 minutes in this encounter including coordination of his care, personally reviewing imaging studies, counseling the patient, placing orders and performing appropriate documentation   Sterling Big, MD [facility] General Surgeon

## 2022-07-25 NOTE — Patient Instructions (Addendum)
Pick up your medications at the pharmacy. Please follow your bowel prep instructions. If you have any questions or concerns please call our office.    Our surgery scheduler will call you within 24-48 hours to schedule your surgery. Please have the [place] surgery sheet available when speaking with her.

## 2022-07-30 ENCOUNTER — Telehealth: Payer: Self-pay | Admitting: Surgery

## 2022-07-30 NOTE — Telephone Encounter (Signed)
Patient has been advised of Pre-Admission date/time, and Surgery date.  Surgery Date: 08/21/22 @ [facility] Preadmission Testing Date: 08/13/22 (phone 8a-1p)  Patient has been made aware to call [phone], between 1-3:00pm the day before surgery, to find out what time to arrive for surgery.

## 2022-08-02 ENCOUNTER — Telehealth: Payer: Self-pay | Admitting: *Deleted

## 2022-08-02 NOTE — Telephone Encounter (Signed)
Patients sister called Erik Johns and would like for you to call her back in regards to his surgery that is scheduled on 08/21/22. She just wants a good understanding of what is going to take place and how the surgery is done. Patient listed his sister as a contact person that we are able to speak with.   Her number is [phone]

## 2022-08-13 ENCOUNTER — Inpatient Hospital Stay: Admission: RE | Admit: 2022-08-13 | Payer: Commercial Managed Care - PPO | Source: Ambulatory Visit

## 2022-08-13 HISTORY — DX: Colostomy status: Z93.3

## 2022-08-13 HISTORY — DX: Diverticulitis of large intestine without perforation or abscess without bleeding: K57.32

## 2022-08-15 ENCOUNTER — Encounter
Admission: RE | Admit: 2022-08-15 | Discharge: 2022-08-15 | Disposition: A | Discharge status: Home / Self Care | Payer: Commercial Managed Care - PPO | Source: Ambulatory Visit | Attending: Surgery | Admitting: Surgery

## 2022-08-15 ENCOUNTER — Encounter: Payer: Self-pay | Admitting: Surgery

## 2022-08-15 ENCOUNTER — Other Ambulatory Visit: Payer: Self-pay

## 2022-08-15 NOTE — Patient Instructions (Addendum)
Your procedure is scheduled on: August 21, 2022 TUESDAY Report to the Registration Desk on the 1st floor of the [facility]. To find out your arrival time, please call [phone] between [place] on: FRIDAY August 17, 2022 If your arrival time is 6:00 am, do not arrive prior to that time as the [facility] entrance doors do not open until 6:00 am.  REMEMBER: Instructions that are not followed completely may result in serious medical risk, up to and including death; or upon the discretion of your surgeon and anesthesiologist your surgery may need to be rescheduled.  Do not eat food after midnight the night before surgery.  No gum chewing, lozengers or hard candies.  You may however, drink CLEAR liquids up to 2 hours before you are scheduled to arrive for your surgery. Do not drink anything within 2 hours of your scheduled arrival time.  Clear liquids include: - water  - apple juice without pulp - gatorade (not RED colors) - black coffee or tea (Do NOT add milk or creamers to the coffee or tea) Do NOT drink anything that is not on this list. scheduled arrival time.  TAKE THESE MEDICATIONS THE MORNING OF SURGERY WITH A SIP OF WATER: none   One week prior to surgery: Stop Anti-inflammatories (NSAIDS) such as Advil, Aleve, Ibuprofen, Motrin, Naproxen, Naprosyn and Aspirin based products such as Excedrin, Goodys Powder, BC Powder. Stop ANY OVER THE COUNTER supplements until after surgery. You may however, continue to take Tylenol if needed for pain up until the day of surgery.  No Alcohol for 24 hours before or after surgery.  No Smoking including e-cigarettes for 24 hours prior to surgery.  No chewable tobacco products for at least 6 hours prior to surgery.  No nicotine patches on the day of surgery.  Do not use any "recreational" drugs for at least a week prior to your surgery.  Please be advised that the combination of cocaine and anesthesia may have negative outcomes, up  to and including death. If you test positive for cocaine, your surgery will be cancelled.  On the morning of surgery brush your teeth with toothpaste and water, you may rinse your mouth with mouthwash if you wish. Do not swallow any toothpaste or mouthwash.  Use CHG  wipes as directed on instruction sheet.  Do not wear jewelry, make-up, hairpins, clips or nail polish.  Do not wear lotions, powders, or perfumes OR DEODORANT  Do not shave body from the neck down 48 hours prior to surgery just in case you cut yourself which could leave a site for infection.  Also, freshly shaved skin may become irritated if using the CHG soap.  Contact lenses, hearing aids and dentures may not be worn into surgery.  Do not bring valuables to the hospital. [facility] is not responsible for any missing/lost belongings or valuables.   Fleets enema or bowel prep as directed.  Notify your doctor if there is any change in your medical condition (cold, fever, infection).  Wear comfortable clothing (specific to your surgery type) to the hospital.  After surgery, you can help prevent lung complications by doing breathing exercises.  Take deep breaths and cough every 1-2 hours. Your doctor may order a device called an Incentive Spirometer to help you take deep breaths. When coughing or sneezing, hold a pillow firmly against your incision with both hands. This is called "splinting." Doing this helps protect your incision. It also decreases belly discomfort.  If you are  being admitted to the hospital overnight, leave your suitcase in the car. After surgery it may be brought to your room.  If you are being discharged the day of surgery, you will not be allowed to drive home. You will need a responsible adult (18 years or older) to drive you home and stay with you that night.   If you are taking public transportation, you will need to have a responsible adult (18 years or older) with you. Please confirm with your  physician that it is acceptable to use public transportation.   Please call the [facility] at [phone] if you have any questions about these instructions.  Surgery Visitation Policy:  Patients undergoing a surgery or procedure may have two family members or support persons with them as [place] as the person is not COVID-19 positive or experiencing its symptoms.   Inpatient Visitation:    Visiting hours are 7 a.m. to 8 p.m. Up to four visitors are allowed at one time in a patient room, including children. The visitors may rotate out with other people during the day. One designated support person (adult) may remain overnight.

## 2022-08-17 ENCOUNTER — Encounter: Payer: Self-pay | Admitting: Urgent Care

## 2022-08-17 ENCOUNTER — Encounter
Admission: RE | Admit: 2022-08-17 | Discharge: 2022-08-17 | Disposition: A | Discharge status: Home / Self Care | Payer: Commercial Managed Care - PPO | Source: Ambulatory Visit | Attending: Surgery | Admitting: Surgery

## 2022-08-17 DIAGNOSIS — Z01812 Encounter for preprocedural laboratory examination: Secondary | ICD-10-CM | POA: Diagnosis present

## 2022-08-17 DIAGNOSIS — Z933 Colostomy status: Secondary | ICD-10-CM | POA: Insufficient documentation

## 2022-08-17 LAB — CBC
HCT: 37.5 % — ABNORMAL LOW (ref 39.0–52.0)
Hemoglobin: 12.2 g/dL — ABNORMAL LOW (ref 13.0–17.0)
MCH: 30.6 pg (ref 26.0–34.0)
MCHC: 32.5 g/dL (ref 30.0–36.0)
MCV: 94 fL (ref 80.0–100.0)
Platelets: 346 10*3/uL (ref 150–400)
RBC: 3.99 MIL/uL — ABNORMAL LOW (ref 4.22–5.81)
RDW: 12.3 % (ref 11.5–15.5)
WBC: 10 10*3/uL (ref 4.0–10.5)
nRBC: 0 % (ref 0.0–0.2)

## 2022-08-17 LAB — COMPREHENSIVE METABOLIC PANEL
ALT: 35 U/L (ref 0–44)
AST: 26 U/L (ref 15–41)
Albumin: 4.1 g/dL (ref 3.5–5.0)
Alkaline Phosphatase: 64 U/L (ref 38–126)
Anion gap: 6 (ref 5–15)
BUN: 12 mg/dL (ref 6–20)
CO2: 26 mmol/L (ref 22–32)
Calcium: 9.1 mg/dL (ref 8.9–10.3)
Chloride: 109 mmol/L (ref 98–111)
Creatinine, Ser: 0.9 mg/dL (ref 0.61–1.24)
GFR, Estimated: >60 mL/min (ref >60)
Glucose, Bld: 86 mg/dL (ref 70–99)
Potassium: 3.4 mmol/L — ABNORMAL LOW (ref 3.5–5.1)
Sodium: 141 mmol/L (ref 135–145)
Total Bilirubin: 0.8 mg/dL (ref 0.3–1.2)
Total Protein: 7.7 g/dL (ref 6.5–8.1)

## 2022-08-21 ENCOUNTER — Inpatient Hospital Stay: Payer: Commercial Managed Care - PPO | Admitting: Certified Registered Nurse Anesthetist

## 2022-08-21 ENCOUNTER — Other Ambulatory Visit: Payer: Self-pay

## 2022-08-21 ENCOUNTER — Encounter
Admission: RE | Disposition: A | Discharge status: Home / Self Care | Payer: Self-pay | Source: Home / Self Care | Attending: Surgery

## 2022-08-21 ENCOUNTER — Inpatient Hospital Stay
Admission: RE | Admit: 2022-08-21 | Discharge: 2022-08-23 | DRG: 337 | Disposition: A | Discharge status: Home / Self Care | Payer: Commercial Managed Care - PPO | Attending: Surgery | Admitting: Surgery

## 2022-08-21 ENCOUNTER — Encounter: Payer: Self-pay | Admitting: Surgery

## 2022-08-21 DIAGNOSIS — Z87891 Personal history of nicotine dependence: Secondary | ICD-10-CM | POA: Diagnosis not present

## 2022-08-21 DIAGNOSIS — Z933 Colostomy status: Secondary | ICD-10-CM

## 2022-08-21 DIAGNOSIS — Z9049 Acquired absence of other specified parts of digestive tract: Secondary | ICD-10-CM | POA: Diagnosis not present

## 2022-08-21 DIAGNOSIS — Z433 Encounter for attention to colostomy: Principal | ICD-10-CM

## 2022-08-21 DIAGNOSIS — K5720 Diverticulitis of large intestine with perforation and abscess without bleeding: Secondary | ICD-10-CM

## 2022-08-21 DIAGNOSIS — K660 Peritoneal adhesions (postprocedural) (postinfection): Secondary | ICD-10-CM | POA: Diagnosis present

## 2022-08-21 DIAGNOSIS — Z01812 Encounter for preprocedural laboratory examination: Principal | ICD-10-CM

## 2022-08-21 DIAGNOSIS — D72828 Other elevated white blood cell count: Secondary | ICD-10-CM | POA: Diagnosis not present

## 2022-08-21 DIAGNOSIS — Z9889 Other specified postprocedural states: Secondary | ICD-10-CM

## 2022-08-21 DIAGNOSIS — K5732 Diverticulitis of large intestine without perforation or abscess without bleeding: Secondary | ICD-10-CM

## 2022-08-21 HISTORY — PX: COLOSTOMY REVERSAL: SHX5782

## 2022-08-21 HISTORY — PX: COLOSTOMY TAKEDOWN: SHX5258

## 2022-08-21 LAB — CBC
HCT: 41.3 % (ref 39.0–52.0)
Hemoglobin: 13.3 g/dL (ref 13.0–17.0)
MCH: 30 pg (ref 26.0–34.0)
MCHC: 32.2 g/dL (ref 30.0–36.0)
MCV: 93.2 fL (ref 80.0–100.0)
Platelets: 354 10*3/uL (ref 150–400)
RBC: 4.43 MIL/uL (ref 4.22–5.81)
RDW: 12.5 % (ref 11.5–15.5)
WBC: 16.6 10*3/uL — ABNORMAL HIGH (ref 4.0–10.5)
nRBC: 0 % (ref 0.0–0.2)

## 2022-08-21 LAB — CREATININE, SERUM
Creatinine, Ser: 1.19 mg/dL (ref 0.61–1.24)
GFR, Estimated: >60 mL/min (ref >60)

## 2022-08-21 SURGERY — CLOSURE, COLOSTOMY, LAPAROSCOPIC
Anesthesia: General

## 2022-08-21 MED ORDER — CHLORHEXIDINE GLUCONATE CLOTH 2 % EX PADS: 6.0000 | MEDICATED_PAD | Freq: Once | CUTANEOUS | Status: DC

## 2022-08-21 MED ORDER — LACTATED RINGERS IV SOLN: INTRAVENOUS | Status: DC

## 2022-08-21 MED ORDER — DROPERIDOL 2.5 MG/ML IJ SOLN: 0.6250 mg | Freq: Once | INTRAMUSCULAR | Status: DC | PRN

## 2022-08-21 MED ORDER — BUPIVACAINE-EPINEPHRINE (PF) 0.25% -1:200000 IJ SOLN
INTRAMUSCULAR | Status: AC
  Filled 2022-08-21: qty 30

## 2022-08-21 MED ORDER — PROPOFOL 10 MG/ML IV BOLUS
INTRAVENOUS | Status: DC | PRN
  Administered 2022-08-21: 100 mg via INTRAVENOUS
  Administered 2022-08-21: 50 mg via INTRAVENOUS
  Administered 2022-08-21: 100 mg via INTRAVENOUS

## 2022-08-21 MED ORDER — SODIUM CHLORIDE 0.9 % IV SOLN: INTRAVENOUS | Status: DC

## 2022-08-21 MED ORDER — SODIUM CHLORIDE 0.9 % IV SOLN
2.0000 g | INTRAVENOUS | Status: AC
  Administered 2022-08-21: 2 g via INTRAVENOUS

## 2022-08-21 MED ORDER — KETOROLAC TROMETHAMINE 30 MG/ML IJ SOLN
30.0000 mg | Freq: Four times a day (QID) | INTRAMUSCULAR | Status: DC
  Administered 2022-08-21 – 2022-08-23 (×8): 30 mg via INTRAVENOUS
  Filled 2022-08-21 (×8): qty 1

## 2022-08-21 MED ORDER — ACETAMINOPHEN 10 MG/ML IV SOLN: 1000.0000 mg | Freq: Once | INTRAVENOUS | Status: DC | PRN

## 2022-08-21 MED ORDER — GABAPENTIN 300 MG PO CAPS: 300.0000 mg | ORAL_CAPSULE | ORAL | Status: AC

## 2022-08-21 MED ORDER — PROPOFOL 500 MG/50ML IV EMUL
INTRAVENOUS | Status: DC | PRN
  Administered 2022-08-21: 10 ug/kg/min via INTRAVENOUS
  Administered 2022-08-21: 10 mg via INTRAVENOUS

## 2022-08-21 MED ORDER — DEXMEDETOMIDINE HCL IN NACL 200 MCG/50ML IV SOLN
INTRAVENOUS | Status: DC | PRN
  Administered 2022-08-21 (×2): 8 ug via INTRAVENOUS
  Administered 2022-08-21 (×2): 12 ug via INTRAVENOUS

## 2022-08-21 MED ORDER — ROCURONIUM BROMIDE 100 MG/10ML IV SOLN
INTRAVENOUS | Status: DC | PRN
  Administered 2022-08-21: 80 mg via INTRAVENOUS
  Administered 2022-08-21: 20 mg via INTRAVENOUS
  Administered 2022-08-21 (×2): 10 mg via INTRAVENOUS

## 2022-08-21 MED ORDER — PROCHLORPERAZINE MALEATE 10 MG PO TABS
10.0000 mg | ORAL_TABLET | Freq: Four times a day (QID) | ORAL | Status: DC | PRN
Start: 2022-08-21 — End: 2022-08-23

## 2022-08-21 MED ORDER — DIPHENHYDRAMINE HCL 12.5 MG/5ML PO ELIX: 12.5000 mg | ORAL_SOLUTION | Freq: Four times a day (QID) | ORAL | Status: DC | PRN

## 2022-08-21 MED ORDER — FENTANYL CITRATE (PF) 100 MCG/2ML IJ SOLN
INTRAMUSCULAR | Status: AC
  Filled 2022-08-21: qty 2

## 2022-08-21 MED ORDER — SODIUM CHLORIDE 0.9 % IV SOLN
INTRAVENOUS | Status: DC | PRN
  Administered 2022-08-21: 70 mL

## 2022-08-21 MED ORDER — DIPHENHYDRAMINE HCL 50 MG/ML IJ SOLN: 12.5000 mg | Freq: Four times a day (QID) | INTRAMUSCULAR | Status: DC | PRN

## 2022-08-21 MED ORDER — CELECOXIB 200 MG PO CAPS: 200.0000 mg | ORAL_CAPSULE | ORAL | Status: AC

## 2022-08-21 MED ORDER — PROPOFOL 10 MG/ML IV BOLUS
INTRAVENOUS | Status: AC
  Filled 2022-08-21: qty 40

## 2022-08-21 MED ORDER — SODIUM CHLORIDE 0.9 % IV SOLN
INTRAVENOUS | Status: AC
  Filled 2022-08-21: qty 2

## 2022-08-21 MED ORDER — LIDOCAINE HCL (CARDIAC) PF 100 MG/5ML IV SOSY
PREFILLED_SYRINGE | INTRAVENOUS | Status: DC | PRN
  Administered 2022-08-21: 100 mg via INTRAVENOUS

## 2022-08-21 MED ORDER — ESMOLOL HCL 100 MG/10ML IV SOLN
INTRAVENOUS | Status: DC | PRN
  Administered 2022-08-21: 30 mg via INTRAVENOUS
  Administered 2022-08-21: 20 mg via INTRAVENOUS

## 2022-08-21 MED ORDER — HYDROMORPHONE HCL 1 MG/ML IJ SOLN
INTRAMUSCULAR | Status: DC | PRN
  Administered 2022-08-21: 1 mg via INTRAVENOUS

## 2022-08-21 MED ORDER — FAMOTIDINE 20 MG PO TABS
ORAL_TABLET | ORAL | Status: AC
  Administered 2022-08-21: 20 mg via ORAL
  Filled 2022-08-21: qty 1

## 2022-08-21 MED ORDER — SUGAMMADEX SODIUM 200 MG/2ML IV SOLN
INTRAVENOUS | Status: DC | PRN
  Administered 2022-08-21: 100 mg via INTRAVENOUS
  Administered 2022-08-21 (×2): 50 mg via INTRAVENOUS

## 2022-08-21 MED ORDER — ONDANSETRON 4 MG PO TBDP: 4.0000 mg | ORAL_TABLET | Freq: Four times a day (QID) | ORAL | Status: DC | PRN

## 2022-08-21 MED ORDER — 0.9 % SODIUM CHLORIDE (POUR BTL) OPTIME
TOPICAL | Status: DC | PRN
  Administered 2022-08-21: 400 mL

## 2022-08-21 MED ORDER — OXYCODONE HCL 5 MG/5ML PO SOLN: 5.0000 mg | Freq: Once | ORAL | Status: DC | PRN

## 2022-08-21 MED ORDER — ONDANSETRON HCL 4 MG/2ML IJ SOLN: 4.0000 mg | Freq: Four times a day (QID) | INTRAMUSCULAR | Status: DC | PRN

## 2022-08-21 MED ORDER — BUPIVACAINE-EPINEPHRINE (PF) 0.25% -1:200000 IJ SOLN
INTRAMUSCULAR | Status: DC | PRN
  Administered 2022-08-21: 30 mL

## 2022-08-21 MED ORDER — OXYCODONE HCL 5 MG PO TABS
5.0000 mg | ORAL_TABLET | ORAL | Status: DC | PRN
  Administered 2022-08-21: 5 mg via ORAL
  Administered 2022-08-22: 10 mg via ORAL
  Filled 2022-08-21: qty 1
  Filled 2022-08-21: qty 2

## 2022-08-21 MED ORDER — ENOXAPARIN SODIUM 40 MG/0.4ML IJ SOSY
40.0000 mg | PREFILLED_SYRINGE | INTRAMUSCULAR | Status: DC
  Filled 2022-08-21: qty 0.4

## 2022-08-21 MED ORDER — CHLORHEXIDINE GLUCONATE 0.12 % MT SOLN: 15.0000 mL | Freq: Once | OROMUCOSAL | Status: AC

## 2022-08-21 MED ORDER — SODIUM CHLORIDE (PF) 0.9 % IJ SOLN
INTRAMUSCULAR | Status: AC
  Filled 2022-08-21: qty 50

## 2022-08-21 MED ORDER — MIDAZOLAM HCL 2 MG/2ML IJ SOLN
INTRAMUSCULAR | Status: AC
  Filled 2022-08-21: qty 2

## 2022-08-21 MED ORDER — FENTANYL CITRATE (PF) 100 MCG/2ML IJ SOLN
25.0000 ug | INTRAMUSCULAR | Status: DC | PRN
  Administered 2022-08-21: 50 ug via INTRAVENOUS
  Administered 2022-08-21 (×3): 25 ug via INTRAVENOUS

## 2022-08-21 MED ORDER — PROCHLORPERAZINE EDISYLATE 10 MG/2ML IJ SOLN: 5.0000 mg | Freq: Four times a day (QID) | INTRAMUSCULAR | Status: DC | PRN

## 2022-08-21 MED ORDER — PHENYLEPHRINE HCL (PRESSORS) 10 MG/ML IV SOLN
INTRAVENOUS | Status: DC | PRN
  Administered 2022-08-21: 240 ug via INTRAVENOUS
  Administered 2022-08-21: 160 ug via INTRAVENOUS
  Administered 2022-08-21: 240 ug via INTRAVENOUS
  Administered 2022-08-21: 80 ug via INTRAVENOUS

## 2022-08-21 MED ORDER — DEXAMETHASONE SODIUM PHOSPHATE 10 MG/ML IJ SOLN
INTRAMUSCULAR | Status: DC | PRN
  Administered 2022-08-21: 10 mg via INTRAVENOUS

## 2022-08-21 MED ORDER — PANTOPRAZOLE SODIUM 40 MG IV SOLR
40.0000 mg | Freq: Every day | INTRAVENOUS | Status: DC
  Administered 2022-08-21 – 2022-08-22 (×2): 40 mg via INTRAVENOUS
  Filled 2022-08-21 (×2): qty 10

## 2022-08-21 MED ORDER — MORPHINE SULFATE (PF) 4 MG/ML IV SOLN: 4.0000 mg | INTRAVENOUS | Status: DC | PRN

## 2022-08-21 MED ORDER — LABETALOL HCL 5 MG/ML IV SOLN
INTRAVENOUS | Status: DC | PRN
  Administered 2022-08-21: 5 mg via INTRAVENOUS

## 2022-08-21 MED ORDER — SODIUM CHLORIDE 0.9 % IV SOLN
2.0000 g | Freq: Three times a day (TID) | INTRAVENOUS | Status: AC
  Administered 2022-08-21 – 2022-08-22 (×3): 2 g via INTRAVENOUS
  Filled 2022-08-21 (×3): qty 2

## 2022-08-21 MED ORDER — ACETAMINOPHEN 500 MG PO TABS
ORAL_TABLET | ORAL | Status: AC
  Administered 2022-08-21: 1000 mg via ORAL
  Filled 2022-08-21: qty 2

## 2022-08-21 MED ORDER — GLYCOPYRROLATE 0.2 MG/ML IJ SOLN
INTRAMUSCULAR | Status: DC | PRN
  Administered 2022-08-21: .2 mg via INTRAVENOUS

## 2022-08-21 MED ORDER — CHLORHEXIDINE GLUCONATE 0.12 % MT SOLN
OROMUCOSAL | Status: AC
  Administered 2022-08-21: 15 mL via OROMUCOSAL
  Filled 2022-08-21: qty 15

## 2022-08-21 MED ORDER — ALVIMOPAN 12 MG PO CAPS
ORAL_CAPSULE | ORAL | Status: AC
  Administered 2022-08-21: 12 mg via ORAL
  Filled 2022-08-21: qty 1

## 2022-08-21 MED ORDER — ALVIMOPAN 12 MG PO CAPS
12.0000 mg | ORAL_CAPSULE | ORAL | Status: DC
  Filled 2022-08-21: qty 1

## 2022-08-21 MED ORDER — OXYCODONE HCL 5 MG PO TABS: 5.0000 mg | ORAL_TABLET | Freq: Once | ORAL | Status: DC | PRN

## 2022-08-21 MED ORDER — MIDAZOLAM HCL 2 MG/2ML IJ SOLN
INTRAMUSCULAR | Status: DC | PRN
  Administered 2022-08-21: 2 mg via INTRAVENOUS

## 2022-08-21 MED ORDER — ACETAMINOPHEN 500 MG PO TABS: 1000.0000 mg | ORAL_TABLET | ORAL | Status: AC

## 2022-08-21 MED ORDER — FENTANYL CITRATE (PF) 100 MCG/2ML IJ SOLN
INTRAMUSCULAR | Status: DC | PRN
  Administered 2022-08-21: 50 ug via INTRAVENOUS
  Administered 2022-08-21: 100 ug via INTRAVENOUS
  Administered 2022-08-21: 50 ug via INTRAVENOUS

## 2022-08-21 MED ORDER — LACTATED RINGERS IV SOLN: INTRAVENOUS | Status: DC | PRN

## 2022-08-21 MED ORDER — SODIUM CHLORIDE (PF) 0.9 % IJ SOLN: INTRAMUSCULAR | Status: DC | PRN

## 2022-08-21 MED ORDER — ONDANSETRON HCL 4 MG/2ML IJ SOLN
INTRAMUSCULAR | Status: DC | PRN
  Administered 2022-08-21: 4 mg via INTRAVENOUS

## 2022-08-21 MED ORDER — FAMOTIDINE 20 MG PO TABS: 20.0000 mg | ORAL_TABLET | Freq: Once | ORAL | Status: AC

## 2022-08-21 MED ORDER — ORAL CARE MOUTH RINSE: 15.0000 mL | Freq: Once | OROMUCOSAL | Status: AC

## 2022-08-21 MED ORDER — ALVIMOPAN 12 MG PO CAPS: 12.0000 mg | ORAL_CAPSULE | ORAL | Status: AC

## 2022-08-21 MED ORDER — PROPOFOL 10 MG/ML IV BOLUS
INTRAVENOUS | Status: AC
  Filled 2022-08-21: qty 20

## 2022-08-21 MED ORDER — PROMETHAZINE HCL 25 MG/ML IJ SOLN: 6.2500 mg | INTRAMUSCULAR | Status: DC | PRN

## 2022-08-21 MED ORDER — SEVOFLURANE IN SOLN
RESPIRATORY_TRACT | Status: AC
  Filled 2022-08-21: qty 250

## 2022-08-21 MED ORDER — HYDROMORPHONE HCL 1 MG/ML IJ SOLN: 0.2500 mg | INTRAMUSCULAR | Status: DC | PRN

## 2022-08-21 MED ORDER — GABAPENTIN 300 MG PO CAPS
ORAL_CAPSULE | ORAL | Status: AC
  Administered 2022-08-21: 300 mg via ORAL
  Filled 2022-08-21: qty 1

## 2022-08-21 MED ORDER — FENTANYL CITRATE (PF) 100 MCG/2ML IJ SOLN
INTRAMUSCULAR | Status: AC
  Administered 2022-08-21: 25 ug via INTRAVENOUS
  Filled 2022-08-21: qty 2

## 2022-08-21 MED ORDER — ACETAMINOPHEN 500 MG PO TABS
1000.0000 mg | ORAL_TABLET | Freq: Four times a day (QID) | ORAL | Status: DC
  Administered 2022-08-21 – 2022-08-23 (×7): 1000 mg via ORAL
  Filled 2022-08-21 (×8): qty 2

## 2022-08-21 MED ORDER — CELECOXIB 200 MG PO CAPS
ORAL_CAPSULE | ORAL | Status: AC
  Administered 2022-08-21: 200 mg via ORAL
  Filled 2022-08-21: qty 1

## 2022-08-21 MED ORDER — HYDROMORPHONE HCL 1 MG/ML IJ SOLN
INTRAMUSCULAR | Status: AC
  Filled 2022-08-21: qty 1

## 2022-08-21 MED ORDER — BUPIVACAINE LIPOSOME 1.3 % IJ SUSP
INTRAMUSCULAR | Status: AC
  Filled 2022-08-21: qty 20

## 2022-08-21 SURGICAL SUPPLY — 85 items
ADH SKN CLS APL DERMABOND .7 (GAUZE/BANDAGES/DRESSINGS) ×2
APPLIER CLIP 13 LRG OPEN (CLIP)
APR CLP LRG 13 20 CLIP (CLIP)
BAG DECANTER FOR FLEXI CONT (MISCELLANEOUS) ×1 IMPLANT
BLADE CLIPPER SURG (BLADE) ×1 IMPLANT
BLADE SURG SZ10 CARB STEEL (BLADE) ×1 IMPLANT
BLADE SURG SZ11 CARB STEEL (BLADE) ×1 IMPLANT
BULB RESERV EVAC DRAIN JP 100C (MISCELLANEOUS) ×1 IMPLANT
CATH ROBINSON RED A/P 14FR (CATHETERS) ×1 IMPLANT
CLIP APPLIE 13 LRG OPEN (CLIP) ×1 IMPLANT
DERMABOND ADVANCED (GAUZE/BANDAGES/DRESSINGS) ×2
DERMABOND ADVANCED .7 DNX12 (GAUZE/BANDAGES/DRESSINGS) ×2 IMPLANT
DRAPE INCISE IOBAN 66X45 STRL (DRAPES) ×1 IMPLANT
DRAPE LEGGINS SURG 28X43 STRL (DRAPES) ×1 IMPLANT
DRAPE UNDER BUTTOCK W/FLU (DRAPES) ×1 IMPLANT
DRSG OPSITE POSTOP 3X4 (GAUZE/BANDAGES/DRESSINGS) IMPLANT
DRSG OPSITE POSTOP 4X10 (GAUZE/BANDAGES/DRESSINGS) IMPLANT
ELECT BLADE 6.5 EXT (BLADE) ×1 IMPLANT
ELECT CAUTERY BLADE 6.4 (BLADE) ×1 IMPLANT
ELECT CAUTERY BLADE TIP 2.5 (TIP) ×1
ELECT REM PT RETURN 9FT ADLT (ELECTROSURGICAL) ×1
ELECTRODE CAUTERY BLDE TIP 2.5 (TIP) ×1 IMPLANT
ELECTRODE REM PT RTRN 9FT ADLT (ELECTROSURGICAL) ×1 IMPLANT
GAUZE SPONGE 4X4 12PLY STRL (GAUZE/BANDAGES/DRESSINGS) ×1 IMPLANT
GLOVE BIO SURGEON STRL SZ7 (GLOVE) ×2 IMPLANT
GOWN STRL REUS W/ TWL LRG LVL3 (GOWN DISPOSABLE) ×4 IMPLANT
GOWN STRL REUS W/TWL LRG LVL3 (GOWN DISPOSABLE) ×6
HANDLE SUCTION POOLE (INSTRUMENTS) ×1 IMPLANT
HANDLE YANKAUER SUCT BULB TIP (MISCELLANEOUS) ×1 IMPLANT
IRRIGATION STRYKERFLOW (MISCELLANEOUS) ×1 IMPLANT
IRRIGATOR STRYKERFLOW (MISCELLANEOUS) ×1
IV NS 1000ML (IV SOLUTION) ×1
IV NS 1000ML BAXH (IV SOLUTION) ×1 IMPLANT
JACKSON PRATT 10 (INSTRUMENTS) ×1 IMPLANT
L-HOOK LAP DISP 36CM (ELECTROSURGICAL)
LHOOK LAP DISP 36CM (ELECTROSURGICAL) ×1 IMPLANT
MANIFOLD NEPTUNE II (INSTRUMENTS) ×1 IMPLANT
MARKER SKIN DUAL TIP RULER LAB (MISCELLANEOUS) ×1 IMPLANT
NEEDLE HYPO 22GX1.5 SAFETY (NEEDLE) ×1 IMPLANT
NS IRRIG 1000ML POUR BTL (IV SOLUTION) ×1 IMPLANT
PACK COLON CLEAN CLOSURE (MISCELLANEOUS) ×1 IMPLANT
PACK LAP CHOLECYSTECTOMY (MISCELLANEOUS) ×1 IMPLANT
PENCIL SMOKE EVACUATOR (MISCELLANEOUS) ×1 IMPLANT
RELOAD STAPLE 60 2.6 WHT THN (STAPLE) ×2 IMPLANT
RELOAD STAPLE 60 3.6 BLU REG (STAPLE) ×4 IMPLANT
RELOAD STAPLER BLUE 60MM (STAPLE) IMPLANT
RELOAD STAPLER WHITE 60MM (STAPLE) ×2 IMPLANT
SCISSORS METZENBAUM CVD 33 (INSTRUMENTS) ×1 IMPLANT
SET TUBE SMOKE EVAC HIGH FLOW (TUBING) ×1 IMPLANT
SHEARS HARMONIC ACE PLUS 36CM (ENDOMECHANICALS) ×1 IMPLANT
SLEEVE Z-THREAD 5X100MM (TROCAR) ×1 IMPLANT
SOL PREP PVP 2OZ (MISCELLANEOUS) ×1
SOLUTION PREP PVP 2OZ (MISCELLANEOUS) ×1 IMPLANT
SPONGE T-LAP 18X18 ~~LOC~~+RFID (SPONGE) ×3 IMPLANT
STAPLE ECHEON FLEX 60 POW ENDO (STAPLE) IMPLANT
STAPLER CIRCULAR MANUAL XL 25 (STAPLE) IMPLANT
STAPLER CIRCULAR MANUAL XL 29 (STAPLE) IMPLANT
STAPLER CIRCULAR MANUAL XL 33 (STAPLE) IMPLANT
STAPLER PROXIMATE 75MM BLUE (STAPLE) IMPLANT
STAPLER RELOAD BLUE 60MM (STAPLE)
STAPLER RELOAD WHITE 60MM (STAPLE) ×2
STAPLER SKIN PROX 35W (STAPLE) ×1 IMPLANT
SUCT SIGMOIDOSCOPE TIP 18 W/TU (SUCTIONS) ×1 IMPLANT
SUCTION POOLE HANDLE (INSTRUMENTS) ×1
SUT MNCRL AB 4-0 PS2 18 (SUTURE) ×2 IMPLANT
SUT PDS AB 0 CT1 27 (SUTURE) ×2 IMPLANT
SUT SILK 2 0 (SUTURE) ×1
SUT SILK 2 0SH CR/8 30 (SUTURE) ×1 IMPLANT
SUT SILK 2-0 (SUTURE) ×1 IMPLANT
SUT SILK 2-0 18XBRD TIE 12 (SUTURE) ×1 IMPLANT
SUT SILK 3-0 (SUTURE) ×1 IMPLANT
SUT VIC AB 2-0 SH 27 (SUTURE) ×2
SUT VIC AB 2-0 SH 27XBRD (SUTURE) ×2 IMPLANT
SYR 20ML LL LF (SYRINGE) ×2 IMPLANT
SYR 50ML LL SCALE MARK (SYRINGE) ×1 IMPLANT
SYR TOOMEY IRRIG 70ML (MISCELLANEOUS) ×1
SYRINGE TOOMEY IRRIG 70ML (MISCELLANEOUS) ×1 IMPLANT
SYS LAPSCP GELPORT 120MM (MISCELLANEOUS) ×1
SYSTEM LAPSCP GELPORT 120MM (MISCELLANEOUS) ×1 IMPLANT
TOWEL OR 17X26 4PK STRL BLUE (TOWEL DISPOSABLE) ×1 IMPLANT
TRAP FLUID SMOKE EVACUATOR (MISCELLANEOUS) ×1 IMPLANT
TRAY FOLEY MTR SLVR 16FR STAT (SET/KITS/TRAYS/PACK) ×1 IMPLANT
TROCAR XCEL 12X100 BLDLESS (ENDOMECHANICALS) ×1 IMPLANT
TROCAR XCEL NON-BLD 5MMX100MML (ENDOMECHANICALS) ×1 IMPLANT
WATER STERILE IRR 500ML POUR (IV SOLUTION) ×1 IMPLANT

## 2022-08-21 NOTE — TOC Initial Note (Signed)
Transition of Care [facility]) - Initial/Assessment Note    Patient Details  Name: Quintrell Baze MRN: [id number] Date of Birth: July 23, 1981  Transition of Care [facility]) CM/SW Contact:    Ninfa Meeker, RN Phone Number: 08/21/2022, 12:37 PM  Clinical Narrative:                 Transition of Care Screening Note:  Transition of Care Department [facility]) has reviewed patient and no TOC needs have been identified at this time. We will continue to monitor patient advancement through Interdisciplinary progressions. If new patient transition needs arise, please place a consult.          Patient Goals and CMS Choice        Expected Discharge Plan and Services                                                Prior Living Arrangements/Services                       Activities of Daily Living   ADL Screening (condition at time of admission) Patient's cognitive ability adequate to safely complete daily activities?: Yes Is the patient deaf or have difficulty hearing?: No Does the patient have difficulty seeing, even when wearing glasses/contacts?: No Does the patient have difficulty concentrating, remembering, or making decisions?: No Patient able to express need for assistance with ADLs?: Yes Does the patient have difficulty dressing or bathing?: No Independently performs ADLs?: Yes (appropriate for developmental age) Does the patient have difficulty walking or climbing stairs?: No Weakness of Legs: None Weakness of Arms/Hands: None  Permission Sought/Granted                  Emotional Assessment              Admission diagnosis:  S/P colostomy takedown [Z98.890] Patient Active Problem List   Diagnosis Date Noted   S/P colostomy takedown 08/21/2022   Colostomy in place [facility])    Polyp of colon    Diverticulitis of colon with perforation 03/05/2022   PCP:  [place] [facility]:   [id number] [address], [place]  AT [address] [place] [address] Phone: [phone] Fax: [phone]     Social Determinants of Health (SDOH) Interventions    Readmission Risk Interventions     No data to display

## 2022-08-21 NOTE — Interval H&P Note (Signed)
History and Physical Interval Note:  08/21/2022 7:24 AM  Erik Johns  has presented today for surgery, with the diagnosis of Colostomy.  The various methods of treatment have been discussed with the patient and family. After consideration of risks, benefits and other options for treatment, the patient has consented to  Procedure(s) with comments: LAPAROSCOPIC COLOSTOMY TAKEDOWN, hand assisted, Edison Simon, PA-C to assist (N/A) - Provider requesting 4 hours / 240 minutes for procedure. as a surgical intervention.  The patient's history has been reviewed, patient examined, no change in status, stable for surgery.  I have reviewed the patient's chart and labs.  Questions were answered to the patient's satisfaction.     [place]

## 2022-08-21 NOTE — Transfer of Care (Signed)
Immediate Anesthesia Transfer of Care Note  Patient: Erik Johns  Procedure(s) Performed: LAPAROSCOPIC COLOSTOMY TAKEDOWN, hand assisted, Edison Simon, PA-C to assist  Patient Location: PACU  Anesthesia Type:General  Level of Consciousness: drowsy and patient cooperative  Airway & Oxygen Therapy: Patient Spontanous Breathing and Patient connected to face mask oxygen  Post-op Assessment: Report given to RN and Post -op Vital signs reviewed and stable  Post vital signs: Reviewed and stable  Last Vitals:  Vitals Value Taken Time  BP 134/88 08/21/22 1047  Temp    Pulse 82 08/21/22 1051  Resp 19 08/21/22 1051  SpO2 100 % 08/21/22 1051  Vitals shown include unvalidated device data.  Last Pain:  Vitals:   08/21/22 0620  TempSrc: Temporal  PainSc: 0-No pain         Complications: No notable events documented.

## 2022-08-21 NOTE — Op Note (Signed)
PROCEDURES: 1. Laparoscopic lysis of adhesions 2. Laparoscopic colostomy takedown EEA end to side 3. Laparoscopic takedown of splenic flexure   Pre-operative Diagnosis:Hx hartmann's for perforated diverticulitis  Post-operative Diagnosis: same  Surgeon: Latta   Assistants: Otho Ket PA-C   Anesthesia: General endotracheal anesthesia  ASA Class: 2  Surgeon: Caroleen Hamman , MD FACS  Anesthesia: Gen. with endotracheal tube  Findings: Tension free anastomosis, with excellent perfusion,negative intraop leak test and two intact doughnuts  Estimated Blood Loss: 20cc                Specimens: colostomy          Complications: none                Condition: stable  Procedure Details  The patient was seen again in the Holding Room. The benefits, complications, treatment options, and expected outcomes were discussed with the patient. The risks of bleeding, infection, recurrence of symptoms, failure to resolve symptoms,  bowel injury, any of which could require further surgery were reviewed with the patient.   The patient was taken to Operating Room, identified as Erik Johns and the procedure verified.  A Time Out was held and the above information confirmed.  Prior to the induction of general anesthesia, antibiotic prophylaxis was administered. VTE prophylaxis was in place. General endotracheal anesthesia was then administered and tolerated well. After the induction, the abdomen was prepped with Chloraprep and draped in the sterile fashion. The patient was positioned in lithotomy position. Incision was created on the left lower quadrant where colostomy is in an elliptical fashion.  Please also note that we placed a pursestring suture to close the colostomy. Subcutaneous tissue was divided until we found the fascial planes.  The abdominal cavity was entered under direct visualization and the GelPort device was placed. two 5 mm ports were placed  under direct visualization and  pneumoperitoneum was obtained. There were dense adhesions from the omentum to the abdominal wall that where lysed in the standard fashion with the Harmonic scalpel.  There was significant adhesive disease in the pelvis from the sigmoid to the pelvic wall  These adhesions were lysed with a combination of finger fracturing and scissors  I Identified the rectal stump. Also able to mobilize the splenic flexure to increase the reach of the descending colon stump. I opened the stump of the end colostomy stopped and measure the diameter of the bowel. A 29 mm dilator was perfect size. the anvil device inserted along with the piercing device.  I was able to staple off the and of the descending colon and was able to.  The colon in the antimesenteric area.Mr.  Erik Johns was able to pass a 29 mm standard EEA stapler device through the anus . Under direct visualization we perform an end to side anastomosis with the EEA device. A leak test was performed inflating the colon with a Toomey syringe and a rubber catheter. No evidence of leak was observed. There was also adequate hemostasis.    All the laparoscopic ports were removed and a second look showed no evidence of any bleeding or any other injuries. We changed gloves and place a new tray to close the   abdomen with a -0 PDS suture in a running fashion within the anterior and posterior sheath using the small bite technique. the skin was closed with staples\. Liposomal Marcaine  was injected on all incision sites under direct visualization. Needle and laparotomy count were correct and  there were no immediate occasions.  Please note That [name] was required due to the complexity of the case, to perform anastomosis via the perineal area, to provide appropriate exposure and for the safe completion of the operation  Caroleen Hamman, MD, FACS

## 2022-08-21 NOTE — Interval H&P Note (Signed)
History and Physical Interval Note:  08/21/2022 7:24 AM  Erik Johns  has presented today for surgery, with the diagnosis of Colostomy.  The various methods of treatment have been discussed with the patient and family. After consideration of risks, benefits and other options for treatment, the patient has consented to  Procedure(s) with comments: LAPAROSCOPIC COLOSTOMY TAKEDOWN, hand assisted, Edison Simon, PA-C to assist (N/A) - Provider requesting 4 hours / 240 minutes for procedure. as a surgical intervention.  The patient's history has been reviewed, patient examined, no change in status, stable for surgery.  I have reviewed the patient's chart and labs.  Questions were answered to the patient's satisfaction.     [place]

## 2022-08-21 NOTE — Anesthesia Preprocedure Evaluation (Addendum)
Anesthesia Evaluation  Patient identified by MRN, date of birth, ID band Patient awake    Reviewed: Allergy & Precautions, NPO status , Patient's Chart, lab work & pertinent test results  Airway Mallampati: IV  TM Distance: >3 FB Neck ROM: full    Dental no notable dental hx.    Pulmonary neg pulmonary ROS, Patient abstained from smoking., former smoker,    Pulmonary exam normal        Cardiovascular negative cardio ROS Normal cardiovascular exam     Neuro/Psych negative neurological ROS  negative psych ROS   GI/Hepatic negative GI ROS, Neg liver ROS,   Endo/Other  negative endocrine ROS  Renal/GU      Musculoskeletal   Abdominal Normal abdominal exam  (+)   Peds  Hematology  (+) Blood dyscrasia, anemia ,   Anesthesia Other Findings History of complicated diverticulitis with peritonitis that required an emergent Hartmann procedure 4 1/2 months ago. Lab + for anemia and hypokalemia  Past Medical History: No date: Diverticulitis of colon No date: Presence of colostomy [facility])  Past Surgical History: 03/08/2022: APPENDECTOMY     Comment:  Procedure: APPENDECTOMY;  Surgeon: Jules Husbands, MD;                Location: [facility] ORS;  Service: General;; 03/08/2022: COLECTOMY WITH COLOSTOMY CREATION/HARTMANN PROCEDURE; N/A     Comment:  Procedure: COLECTOMY WITH COLOSTOMY CREATION/HARTMANN               PROCEDURE;  Surgeon: Jules Husbands, MD;  Location: [facility]               ORS;  Service: General;  Laterality: N/A; 06/26/2022: COLONOSCOPY WITH PROPOFOL; N/A     Comment:  Procedure: COLONOSCOPY WITH PROPOFOL;  Surgeon: Lin Landsman, MD;  Location: [place];                Service: Endoscopy;  Laterality: N/A; 03/08/2022: LAPAROTOMY; N/A     Comment:  Procedure: EXPLORATORY LAPAROTOMY;  Surgeon: Jules Husbands, MD;  Location: [facility] ORS;  Service: General;                Laterality:  N/A; 06/26/2022: POLYPECTOMY; N/A     Comment:  Procedure: POLYPECTOMY;  Surgeon: Lin Landsman,               MD;  Location: [place];  Service: Endoscopy;               Laterality: N/A;  BMI    Body Mass Index: 18.72 kg/m      Reproductive/Obstetrics negative OB ROS                            Anesthesia Physical Anesthesia Plan  ASA: 2  Anesthesia Plan: General ETT   Post-op Pain Management: Gabapentin PO (pre-op)*, Tylenol PO (pre-op)* and Celebrex PO (pre-op)*   Induction: Intravenous  PONV Risk Score and Plan: 3 and Ondansetron, Dexamethasone and Midazolam  Airway Management Planned: Oral ETT  Additional Equipment:   Intra-op Plan:   Post-operative Plan: Extubation in OR  Informed Consent: I have reviewed the patients History and Physical, chart, labs and discussed the procedure including the risks, benefits and alternatives for the proposed anesthesia with the patient or authorized representative  who has indicated his/her understanding and acceptance.     Dental advisory given  Plan Discussed with: Anesthesiologist, CRNA and Surgeon  Anesthesia Plan Comments:        Anesthesia Quick Evaluation

## 2022-08-21 NOTE — Anesthesia Procedure Notes (Signed)
Procedure Name: Intubation Date/Time: 08/21/2022 7:47 AM  Performed by: Gayland Curry, CRNAPre-anesthesia Checklist: Patient identified, Emergency Drugs available, Suction available and Patient being monitored Patient Re-evaluated:Patient Re-evaluated prior to induction Oxygen Delivery Method: Circle system utilized Preoxygenation: Pre-oxygenation with 100% oxygen Induction Type: IV induction Ventilation: Mask ventilation without difficulty and Oral airway inserted - appropriate to patient size Laryngoscope Size: Mac and 4 Grade View: Grade II Tube size: 7.0 mm Number of attempts: 1 Placement Confirmation: ETT inserted through vocal cords under direct vision, positive ETCO2 and breath sounds checked- equal and bilateral Secured at: 24 cm Tube secured with: Tape Dental Injury: Teeth and Oropharynx as per pre-operative assessment

## 2022-08-22 ENCOUNTER — Encounter: Payer: Self-pay | Admitting: Surgery

## 2022-08-22 LAB — CBC
HCT: 38.8 % — ABNORMAL LOW (ref 39.0–52.0)
Hemoglobin: 12.5 g/dL — ABNORMAL LOW (ref 13.0–17.0)
MCH: 30.5 pg (ref 26.0–34.0)
MCHC: 32.2 g/dL (ref 30.0–36.0)
MCV: 94.6 fL (ref 80.0–100.0)
Platelets: 333 10*3/uL (ref 150–400)
RBC: 4.1 MIL/uL — ABNORMAL LOW (ref 4.22–5.81)
RDW: 12.3 % (ref 11.5–15.5)
WBC: 24.7 10*3/uL — ABNORMAL HIGH (ref 4.0–10.5)
nRBC: 0 % (ref 0.0–0.2)

## 2022-08-22 LAB — MAGNESIUM: Magnesium: 2.1 mg/dL (ref 1.7–2.4)

## 2022-08-22 LAB — BASIC METABOLIC PANEL
Anion gap: 7 (ref 5–15)
BUN: 11 mg/dL (ref 6–20)
CO2: 24 mmol/L (ref 22–32)
Calcium: 8.7 mg/dL — ABNORMAL LOW (ref 8.9–10.3)
Chloride: 106 mmol/L (ref 98–111)
Creatinine, Ser: 0.97 mg/dL (ref 0.61–1.24)
GFR, Estimated: >60 mL/min (ref >60)
Glucose, Bld: 101 mg/dL — ABNORMAL HIGH (ref 70–99)
Potassium: 3.9 mmol/L (ref 3.5–5.1)
Sodium: 137 mmol/L (ref 135–145)

## 2022-08-22 LAB — SURGICAL PATHOLOGY

## 2022-08-22 MED ORDER — NICOTINE 7 MG/24HR TD PT24
7.0000 mg | MEDICATED_PATCH | Freq: Every day | TRANSDERMAL | Status: DC
  Administered 2022-08-22 – 2022-08-23 (×2): 7 mg via TRANSDERMAL
  Filled 2022-08-22 (×2): qty 1

## 2022-08-22 NOTE — Plan of Care (Signed)
  Problem: Clinical Measurements: Goal: Ability to maintain clinical measurements within normal limits will improve Outcome: Progressing   

## 2022-08-22 NOTE — Anesthesia Postprocedure Evaluation (Signed)
Anesthesia Post Note  Patient: Erik Johns  Procedure(s) Performed: LAPAROSCOPIC COLOSTOMY TAKEDOWN, hand assisted, Edison Simon, PA-C to assist  Patient location during evaluation: PACU Anesthesia Type: General Level of consciousness: awake and alert Pain management: pain level controlled Vital Signs Assessment: post-procedure vital signs reviewed and stable Respiratory status: spontaneous breathing, nonlabored ventilation and respiratory function stable Cardiovascular status: blood pressure returned to baseline and stable Postop Assessment: no apparent nausea or vomiting Anesthetic complications: no   No notable events documented.   Last Vitals:  Vitals:   08/22/22 0159 08/22/22 0813  BP: 129/84 (!) 139/91  Pulse: 68 75  Resp: 15 16  Temp: 36.8 C 36.9 C  SpO2: 96% 96%    Last Pain:  Vitals:   08/22/22 0821  TempSrc:   PainSc: 2                  [place] Ouch

## 2022-08-22 NOTE — Progress Notes (Signed)
[facility] Hospital Day(s): 1.   Post op day(s): 1 Day Post-Op.   Interval History:  Patient seen and examined No acute events or new complaints overnight.  Patient reports he is doing well Abdominal soreness; Toradol and tylenol helping No fever, chills, nausea, emesis He does have leukocytosis to 24.7K; likely reactive from surgery Hgb to 12.5 (stable) Renal function normal with sCr - 0.97; UO - 1635 ccs No electrolyte derangements He is on FLD; tolerating He is passing flatus consistently and has passed small amounts of liquids from rectum   Vital signs in last 24 hours: [min-max] current  Temp:  [96.8 F (36 C)-98.3 F (36.8 C)] 98.3 F (36.8 C) (09/06 0159) Pulse Rate:  [58-90] 68 (09/06 0159) Resp:  [10-19] 15 (09/06 0159) BP: (129-154)/(73-93) 129/84 (09/06 0159) SpO2:  [91 %-100 %] 96 % (09/06 0159)     Height: 6' (182.9 cm) Weight: 62.6 kg BMI (Calculated): 18.71   Intake/Output last 2 shifts:  09/05 0701 - 09/06 0700 In: 2800 [I.V.:2700; IV Piggyback:100] Out: 1685 [Urine:1635; Blood:50]   Physical Exam:  Constitutional: alert, cooperative and no distress  Respiratory: breathing non-labored at rest  Cardiovascular: regular rate and sinus rhythm  Gastrointestinal: soft, non-tender, and non-distended, no rebound/guarding Integumentary: Laparoscopic and previous colostomy site are CDI with staples and honeycomb dressings  Labs:     Latest Ref Rng & Units 08/22/2022    4:53 AM 08/21/2022   12:31 PM 08/17/2022    8:57 AM  CBC  WBC 4.0 - 10.5 K/uL 24.7  16.6  10.0   Hemoglobin 13.0 - 17.0 g/dL 12.5  13.3  12.2   Hematocrit 39.0 - 52.0 % 38.8  41.3  37.5   Platelets 150 - 400 K/uL 333  354  346       Latest Ref Rng & Units 08/22/2022    4:53 AM 08/21/2022   12:31 PM 08/17/2022    8:57 AM  CMP  Glucose 70 - 99 mg/dL 101   86   BUN 6 - 20 mg/dL 11   12   Creatinine 0.61 - 1.24 mg/dL 0.97  1.19  0.90   Sodium 135 - 145  mmol/L 137   141   Potassium 3.5 - 5.1 mmol/L 3.9   3.4   Chloride 98 - 111 mmol/L 106   109   CO2 22 - 32 mmol/L 24   26   Calcium 8.9 - 10.3 mg/dL 8.7   9.1   Total Protein 6.5 - 8.1 g/dL   7.7   Total Bilirubin 0.3 - 1.2 mg/dL   0.8   Alkaline Phos 38 - 126 U/L   64   AST 15 - 41 U/L   26   ALT 0 - 44 U/L   35     Imaging studies: No new pertinent imaging studies   Assessment/Plan:  41 y.o. male doing remarkably 1 Day Post-Op s/p colostomy takedown   - Given significant bowel function and that he is doing well clinically, will advance to soft diet for lunch/dinner today  - Discontinue Entereg given bowel function  - Discontinue IVF   - Monitor abdominal examination; on-going bowel function - Pain control prn; antiemetics prn - Monitor leukocytosis; likely reactive; repeat in AM  - Mobilize   - Discharge Planning: He is doing well, pending diet advancement and repeat CBC tomorrow. If he continues to do well, may be ready for DC in 24-48 hours  All of the  above findings and recommendations were discussed with the patient, and the medical team, and all of patient's questions were answered to his expressed satisfaction.  -- Edison Simon, PA-C [facility] 08/22/2022, 7:00 AM M-F: 7am - 4pm

## 2022-08-23 LAB — BASIC METABOLIC PANEL
Anion gap: 6 (ref 5–15)
BUN: 16 mg/dL (ref 6–20)
CO2: 28 mmol/L (ref 22–32)
Calcium: 8.7 mg/dL — ABNORMAL LOW (ref 8.9–10.3)
Chloride: 106 mmol/L (ref 98–111)
Creatinine, Ser: 1.17 mg/dL (ref 0.61–1.24)
GFR, Estimated: >60 mL/min (ref >60)
Glucose, Bld: 90 mg/dL (ref 70–99)
Potassium: 3.8 mmol/L (ref 3.5–5.1)
Sodium: 140 mmol/L (ref 135–145)

## 2022-08-23 LAB — CBC
HCT: 36.1 % — ABNORMAL LOW (ref 39.0–52.0)
Hemoglobin: 11.5 g/dL — ABNORMAL LOW (ref 13.0–17.0)
MCH: 30.2 pg (ref 26.0–34.0)
MCHC: 31.9 g/dL (ref 30.0–36.0)
MCV: 94.8 fL (ref 80.0–100.0)
Platelets: 299 10*3/uL (ref 150–400)
RBC: 3.81 MIL/uL — ABNORMAL LOW (ref 4.22–5.81)
RDW: 12.6 % (ref 11.5–15.5)
WBC: 15.8 10*3/uL — ABNORMAL HIGH (ref 4.0–10.5)
nRBC: 0 % (ref 0.0–0.2)

## 2022-08-23 MED ORDER — IBUPROFEN 600 MG PO TABS: 600.0000 mg | ORAL_TABLET | Freq: Four times a day (QID) | ORAL | 0 refills | Status: AC | PRN

## 2022-08-23 MED ORDER — NICOTINE 7 MG/24HR TD PT24: 7.0000 mg | MEDICATED_PATCH | Freq: Every day | TRANSDERMAL | 0 refills | Status: AC

## 2022-08-23 MED ORDER — OXYCODONE HCL 5 MG PO TABS: 5.0000 mg | ORAL_TABLET | Freq: Four times a day (QID) | ORAL | 0 refills | Status: DC | PRN

## 2022-08-23 NOTE — Discharge Summary (Signed)
[facility] SURGICAL DISCHARGE SUMMARY  Patient ID: Erik Johns MRN: [id number] DOB/AGE: [birth year] 41 y.o.  Admit date: 08/21/2022 Discharge date: 08/23/2022  Discharge Diagnoses Patient Active Problem List   Diagnosis Date Noted   S/P colostomy takedown 08/21/2022    Consultants None  Procedures 08/21/2022:  Hand assisted laparoscopic colostomy take; splenic flexure mobilization  HPI: Erik Johns is a 41 y.o.am le with history of Hartman's Procedure for perforated diverticulitis who presents to [facility] on 09/05 for colostomy takedown with Dr Dahlia Byes.   Hospital Course: Informed consent was obtained and documented, and patient underwent uneventful colostomy takedown (Dr Dahlia Byes, 08/21/2022).  Post-operatively, patient did remarkably well and had ROBF on POD1. Advancement of patient's diet and ambulation were well-tolerated. The remainder of patient's hospital course was essentially unremarkable, and discharge planning was initiated accordingly with patient safely able to be discharged home with appropriate discharge instructions, pain control, and outpatient follow-up after all of his questions were answered to his expressed satisfaction.   Discharge Condition: Good   Physical Examination:  Constitutional: alert, cooperative and no distress  Respiratory: breathing non-labored at rest  Cardiovascular: regular rate and sinus rhythm  Gastrointestinal: soft, non-tender, and non-distended, no rebound/guarding Integumentary: Laparoscopic and previous colostomy site are CDI with staples and honeycomb dressings (Patient wishes to leave honeycomb dressing on for now)   Allergies as of 08/23/2022   No Known Allergies      Medication List     STOP taking these medications    bisacodyl 5 MG EC tablet Commonly known as: DULCOLAX   metroNIDAZOLE 500 MG tablet Commonly known as: FLAGYL   neomycin 500 MG tablet Commonly known as: MYCIFRADIN   polyethylene glycol  powder 17 GM/SCOOP powder Commonly known as: GLYCOLAX/MIRALAX       TAKE these medications    ibuprofen 600 MG tablet Commonly known as: ADVIL Take 1 tablet (600 mg total) by mouth every 6 (six) hours as needed.   oxyCODONE 5 MG immediate release tablet Commonly known as: Oxy IR/ROXICODONE Take 1 tablet (5 mg total) by mouth every 6 (six) hours as needed for severe pain or breakthrough pain.          Follow-up Information     Tylene Fantasia, PA-C. Go on 09/04/2022.   Specialty: Physician Assistant Why: Go to appointment on 09/19 at 230 PM Contact information: [address] [place] [address] [phone]                  Time spent on discharge management including discussion of hospital course, clinical condition, outpatient instructions, prescriptions, and follow up with the patient and members of the medical team: >30 minutes  -- Edison Simon , PA-C [facility]  08/23/2022, 8:44 AM [phone] M-F: 7am - 4pm

## 2022-08-23 NOTE — Discharge Instructions (Signed)
In addition to included general post-operative instructions,  Diet: Resume home diet.   Activity: No heavy lifting >20 pounds (children, pets, laundry, garbage) or strenuous activity for 6 weeks, but light activity and walking are encouraged. Do not drive or drink alcohol if taking narcotic pain medications or having pain that might distract from driving.  Wound care: You may shower/get incision wet with soapy water and pat dry (do not rub incisions), but no baths or submerging incision underwater until follow-up. Staples can be open to the air. We will remove these in follow up on 09/19  Medications: Resume all home medications. For mild to moderate pain: acetaminophen (Tylenol) or ibuprofen/naproxen (if no kidney disease). Combining Tylenol with alcohol can substantially increase your risk of causing liver disease. Narcotic pain medications, if prescribed, can be used for severe pain, though may cause nausea, constipation, and drowsiness. Do not combine Tylenol and Percocet (or similar) within a 6 hour period as Percocet (and similar) contain(s) Tylenol. If you do not need the narcotic pain medication, you do not need to fill the prescription.  Call office [phone]) at any time if any questions, worsening pain, fevers/chills, bleeding, drainage from incision site, or other concerns.

## 2022-09-04 ENCOUNTER — Other Ambulatory Visit: Payer: Self-pay

## 2022-09-04 ENCOUNTER — Encounter: Payer: Self-pay | Admitting: Physician Assistant

## 2022-09-04 ENCOUNTER — Ambulatory Visit (INDEPENDENT_AMBULATORY_CARE_PROVIDER_SITE_OTHER): Payer: Commercial Managed Care - PPO | Admitting: Physician Assistant

## 2022-09-04 VITALS — BP 139/89 | HR 114 | Temp 98.8°F | Ht 73.0 in | Wt 250.0 lb

## 2022-09-04 DIAGNOSIS — K5732 Diverticulitis of large intestine without perforation or abscess without bleeding: Secondary | ICD-10-CM

## 2022-09-04 DIAGNOSIS — K5720 Diverticulitis of large intestine with perforation and abscess without bleeding: Secondary | ICD-10-CM

## 2022-09-04 DIAGNOSIS — Z09 Encounter for follow-up examination after completed treatment for conditions other than malignant neoplasm: Secondary | ICD-10-CM

## 2022-09-04 DIAGNOSIS — Z933 Colostomy status: Secondary | ICD-10-CM

## 2022-09-04 NOTE — Progress Notes (Signed)
[facility] POST-OP OFFICE VISIT  09/04/2022  HPI: Erik Johns is a 41 y.o. male 14 days s/p colostomy takedown with Dr Dahlia Byes   He is doing well Minimal abdominal soreness No fever, chills, nausea, emesis Bowel movements are softer but returning to normal NO issues with PO intake No issues with incisions  Vital signs: BP 139/89   Pulse (!) 114   Temp 98.8 F (37.1 C) (Oral)   Ht '6\' 1"'$  (1.854 m)   Wt 250 lb (113.4 kg)   SpO2 95%   BMI 32.98 kg/m    Physical Exam: Constitutional: Well appearing male, NAD Abdomen: Soft, non-tender, non-distended, no rebound/guarding Skin: Laparoscopic incisions and previous colostomy site are healing well; staples removed, no erythema or drainage   Assessment/Plan: This is a 41 y.o. male 14 days s/p colostomy takedown with Dr Dahlia Byes    - Jodell Cipro removed; steri-strips placed  - Pain control prn  - Reviewed wound care recommendation  - Reviewed lifting restrictions; 6 weeks total (work note given)  - I will see him again in ~1 month for recheck; He understands to call with questions/concerns in the interim  -- Edison Simon, PA-C [facility] 09/04/2022, 2:44 PM M-F: 7am - 4pm

## 2022-09-04 NOTE — Patient Instructions (Signed)

## 2022-09-05 ENCOUNTER — Telehealth: Payer: Self-pay | Admitting: *Deleted

## 2022-09-05 NOTE — Telephone Encounter (Signed)
Faxed FMLA to [employer] at [phone]

## 2022-09-12 ENCOUNTER — Telehealth: Payer: Self-pay | Admitting: *Deleted

## 2022-09-12 NOTE — Telephone Encounter (Signed)
Faxed updated FMLA to [employer] at [phone]

## 2022-10-02 ENCOUNTER — Encounter: Payer: Commercial Managed Care - PPO | Admitting: Physician Assistant
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
## 2024-08-01 ENCOUNTER — Emergency Department (HOSPITAL_COMMUNITY)

## 2024-08-01 ENCOUNTER — Inpatient Hospital Stay (HOSPITAL_COMMUNITY)
Admission: EM | Admit: 2024-08-01 | Discharge: 2024-08-02 | DRG: 605 | Disposition: A | Discharge status: Home / Self Care | Attending: Surgery | Admitting: Surgery

## 2024-08-01 ENCOUNTER — Other Ambulatory Visit: Payer: Self-pay

## 2024-08-01 ENCOUNTER — Encounter (HOSPITAL_COMMUNITY): Payer: Self-pay

## 2024-08-01 DIAGNOSIS — W3400XA Accidental discharge from unspecified firearms or gun, initial encounter: Principal | ICD-10-CM

## 2024-08-01 DIAGNOSIS — S21131A Puncture wound without foreign body of right front wall of thorax without penetration into thoracic cavity, initial encounter: Secondary | ICD-10-CM | POA: Diagnosis present

## 2024-08-01 DIAGNOSIS — S31131A Puncture wound of abdominal wall without foreign body, left upper quadrant without penetration into peritoneal cavity, initial encounter: Secondary | ICD-10-CM | POA: Diagnosis present

## 2024-08-01 DIAGNOSIS — Z9049 Acquired absence of other specified parts of digestive tract: Secondary | ICD-10-CM

## 2024-08-01 LAB — PROTIME-INR
INR: 1 (ref 0.8–1.2)
Prothrombin Time: 14.1 s (ref 11.4–15.2)

## 2024-08-01 LAB — COMPREHENSIVE METABOLIC PANEL WITH GFR
ALT: 54 U/L — ABNORMAL HIGH (ref 0–44)
AST: 54 U/L — ABNORMAL HIGH (ref 15–41)
Albumin: 3.6 g/dL (ref 3.5–5.0)
Alkaline Phosphatase: 46 U/L (ref 38–126)
Anion gap: 15 (ref 5–15)
BUN: 7 mg/dL (ref 6–20)
CO2: 16 mmol/L — ABNORMAL LOW (ref 22–32)
Calcium: 7.4 mg/dL — ABNORMAL LOW (ref 8.9–10.3)
Chloride: 105 mmol/L (ref 98–111)
Creatinine, Ser: 0.98 mg/dL (ref 0.61–1.24)
GFR, Estimated: 51 mL/min — ABNORMAL LOW (ref >60)
Glucose, Bld: 117 mg/dL — ABNORMAL HIGH (ref 70–99)
Potassium: 2.8 mmol/L — ABNORMAL LOW (ref 3.5–5.1)
Sodium: 136 mmol/L (ref 135–145)
Total Bilirubin: 0.8 mg/dL (ref 0.0–1.2)
Total Protein: 6.5 g/dL (ref 6.5–8.1)

## 2024-08-01 LAB — HIV ANTIBODY (ROUTINE TESTING W REFLEX): HIV Screen 4th Generation wRfx: NONREACTIVE

## 2024-08-01 LAB — BASIC METABOLIC PANEL WITH GFR
Anion gap: 12 (ref 5–15)
BUN: 9 mg/dL (ref 6–20)
CO2: 17 mmol/L — ABNORMAL LOW (ref 22–32)
Calcium: 8.5 mg/dL — ABNORMAL LOW (ref 8.9–10.3)
Chloride: 105 mmol/L (ref 98–111)
Creatinine, Ser: 0.98 mg/dL (ref 0.61–1.24)
GFR, Estimated: >60 mL/min (ref >60)
Glucose, Bld: 135 mg/dL — ABNORMAL HIGH (ref 70–99)
Potassium: 4.9 mmol/L (ref 3.5–5.1)
Sodium: 134 mmol/L — ABNORMAL LOW (ref 135–145)

## 2024-08-01 LAB — CBC
HCT: 41.6 % (ref 39.0–52.0)
Hemoglobin: 13.5 g/dL (ref 13.0–17.0)
MCH: 31.5 pg (ref 26.0–34.0)
MCHC: 32.5 g/dL (ref 30.0–36.0)
MCV: 97.2 fL (ref 80.0–100.0)
Platelets: 258 K/uL (ref 150–400)
RBC: 4.28 MIL/uL (ref 4.22–5.81)
RDW: 11.7 % (ref 11.5–15.5)
WBC: 12.3 K/uL — ABNORMAL HIGH (ref 4.0–10.5)
nRBC: 0 % (ref 0.0–0.2)

## 2024-08-01 LAB — I-STAT CHEM 8, ED
BUN: 6 mg/dL (ref 6–20)
Calcium, Ion: 0.99 mmol/L — ABNORMAL LOW (ref 1.15–1.40)
Chloride: 104 mmol/L (ref 98–111)
Creatinine, Ser: 1.1 mg/dL (ref 0.61–1.24)
Glucose, Bld: 116 mg/dL — ABNORMAL HIGH (ref 70–99)
HCT: 39 % (ref 39.0–52.0)
Hemoglobin: 13.3 g/dL (ref 13.0–17.0)
Potassium: 2.8 mmol/L — ABNORMAL LOW (ref 3.5–5.1)
Sodium: 140 mmol/L (ref 135–145)
TCO2: 18 mmol/L — ABNORMAL LOW (ref 22–32)

## 2024-08-01 LAB — TYPE AND SCREEN
ABO/RH(D): B POS
Antibody Screen: NEGATIVE

## 2024-08-01 LAB — HEMOGLOBIN AND HEMATOCRIT, BLOOD
HCT: 42.3 % (ref 39.0–52.0)
HCT: 39.4 % (ref 39.0–52.0)
Hemoglobin: 13.7 g/dL (ref 13.0–17.0)
Hemoglobin: 12.5 g/dL — ABNORMAL LOW (ref 13.0–17.0)

## 2024-08-01 LAB — I-STAT CG4 LACTIC ACID, ED: Lactic Acid, Venous: 5.4 mmol/L (ref 0.5–1.9)

## 2024-08-01 LAB — ABO/RH: ABO/RH(D): B POS

## 2024-08-01 LAB — ETHANOL: Alcohol, Ethyl (B): 128 mg/dL — ABNORMAL HIGH (ref <15)

## 2024-08-01 MED ORDER — POTASSIUM CHLORIDE CRYS ER 20 MEQ PO TBCR
40.0000 meq | EXTENDED_RELEASE_TABLET | Freq: Once | ORAL | Status: AC
  Administered 2024-08-01: 40 meq via ORAL
  Filled 2024-08-01: qty 2

## 2024-08-01 MED ORDER — ONDANSETRON HCL 4 MG/2ML IJ SOLN: 4.0000 mg | Freq: Four times a day (QID) | INTRAMUSCULAR | Status: DC | PRN

## 2024-08-01 MED ORDER — METOPROLOL TARTRATE 5 MG/5ML IV SOLN: 5.0000 mg | Freq: Four times a day (QID) | INTRAVENOUS | Status: DC | PRN

## 2024-08-01 MED ORDER — ACETAMINOPHEN 500 MG PO TABS
1000.0000 mg | ORAL_TABLET | Freq: Four times a day (QID) | ORAL | Status: DC
  Administered 2024-08-01 – 2024-08-02 (×5): 1000 mg via ORAL
  Filled 2024-08-01 (×6): qty 2

## 2024-08-01 MED ORDER — MELATONIN 3 MG PO TABS
3.0000 mg | ORAL_TABLET | Freq: Every evening | ORAL | Status: DC | PRN
  Administered 2024-08-01: 3 mg via ORAL
  Filled 2024-08-01: qty 1

## 2024-08-01 MED ORDER — IBUPROFEN 200 MG PO TABS
600.0000 mg | ORAL_TABLET | Freq: Four times a day (QID) | ORAL | Status: DC
  Administered 2024-08-01 – 2024-08-02 (×6): 600 mg via ORAL
  Filled 2024-08-01 (×2): qty 3
  Filled 2024-08-01: qty 1
  Filled 2024-08-01 (×3): qty 3

## 2024-08-01 MED ORDER — KCL IN DEXTROSE-NACL 20-5-0.45 MEQ/L-%-% IV SOLN
INTRAVENOUS | Status: AC
  Filled 2024-08-01 (×3): qty 1000

## 2024-08-01 MED ORDER — FENTANYL CITRATE PF 50 MCG/ML IJ SOSY
50.0000 ug | PREFILLED_SYRINGE | Freq: Once | INTRAMUSCULAR | Status: AC
  Administered 2024-08-01: 50 ug via INTRAVENOUS
  Filled 2024-08-01: qty 1

## 2024-08-01 MED ORDER — METHOCARBAMOL 1000 MG/10ML IJ SOLN: 500.0000 mg | Freq: Three times a day (TID) | INTRAMUSCULAR | Status: DC

## 2024-08-01 MED ORDER — OXYCODONE HCL 5 MG PO TABS
5.0000 mg | ORAL_TABLET | ORAL | Status: DC | PRN
  Administered 2024-08-01 – 2024-08-02 (×7): 5 mg via ORAL
  Filled 2024-08-01 (×7): qty 1

## 2024-08-01 MED ORDER — HYDROMORPHONE HCL 1 MG/ML IJ SOLN
0.5000 mg | INTRAMUSCULAR | Status: DC | PRN
  Administered 2024-08-01 – 2024-08-02 (×6): 0.5 mg via INTRAVENOUS
  Filled 2024-08-01: qty 1
  Filled 2024-08-01 (×4): qty 0.5
  Filled 2024-08-01: qty 1

## 2024-08-01 MED ORDER — METHOCARBAMOL 500 MG PO TABS
500.0000 mg | ORAL_TABLET | Freq: Three times a day (TID) | ORAL | Status: DC
  Administered 2024-08-01 – 2024-08-02 (×5): 500 mg via ORAL
  Filled 2024-08-01 (×5): qty 1

## 2024-08-01 MED ORDER — HYDRALAZINE HCL 20 MG/ML IJ SOLN: 10.0000 mg | INTRAMUSCULAR | Status: DC | PRN

## 2024-08-01 MED ORDER — IOHEXOL 350 MG/ML SOLN
75.0000 mL | Freq: Once | INTRAVENOUS | Status: AC | PRN
  Administered 2024-08-01: 75 mL via INTRAVENOUS

## 2024-08-01 MED ORDER — ONDANSETRON 4 MG PO TBDP: 4.0000 mg | ORAL_TABLET | Freq: Four times a day (QID) | ORAL | Status: DC | PRN

## 2024-08-01 MED ORDER — DOCUSATE SODIUM 100 MG PO CAPS
100.0000 mg | ORAL_CAPSULE | Freq: Two times a day (BID) | ORAL | Status: DC
  Administered 2024-08-01 – 2024-08-02 (×3): 100 mg via ORAL
  Filled 2024-08-01 (×3): qty 1

## 2024-08-01 MED ORDER — POLYETHYLENE GLYCOL 3350 17 G PO PACK: 17.0000 g | PACK | Freq: Every day | ORAL | Status: DC | PRN

## 2024-08-01 MED ORDER — POTASSIUM CHLORIDE 10 MEQ/100ML IV SOLN
10.0000 meq | INTRAVENOUS | Status: AC
  Administered 2024-08-01 (×4): 10 meq via INTRAVENOUS
  Filled 2024-08-01 (×4): qty 100
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
## 2024-08-01 NOTE — ED Notes (Signed)
 Family at bedside; Dr.Rees at bedside updating family at pts request
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
## 2024-08-01 NOTE — ED Notes (Signed)
 Patient transported to CT

## 2024-08-02 ENCOUNTER — Encounter (HOSPITAL_COMMUNITY): Payer: Self-pay | Admitting: Surgery

## 2024-08-02 MED ORDER — OXYCODONE-ACETAMINOPHEN 5-325 MG PO TABS: 1.0000 | ORAL_TABLET | ORAL | 0 refills | Status: AC | PRN
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
## 2024-08-02 NOTE — Progress Notes (Signed)
 Transition of Care [facility]) - CAGE-AID Screening   Patient Details  Name: Erik Johns MRN: [id number] Date of Birth: 1981/03/01  MARINDA LIONEL [name], RN Phone Number: 08/02/2024, 5:39 AM   Clinical Narrative:  Pt reports +tobacco, occasional etoh usage with no hx of w/d symptoms, denies drug usage.  Pt does not want any resources at this time.   CAGE-AID Screening:    Have You Ever Felt You Ought to Cut Down on Your Drinking or Drug Use?: No Have People Annoyed You By Critizing Your Drinking Or Drug Use?: No Have You Felt Bad Or Guilty About Your Drinking Or Drug Use?: No Have You Ever Had a Drink or Used Drugs First Thing In The Morning to Steady Your Nerves or to Get Rid of a Hangover?: No CAGE-AID Score: 0  Substance Abuse Education Offered: Yes

## 2024-08-03 ENCOUNTER — Encounter: Payer: Self-pay | Admitting: Physician Assistant

## 2024-08-13 ENCOUNTER — Other Ambulatory Visit: Payer: Self-pay | Admitting: Physician Assistant

## 2024-08-13 ENCOUNTER — Ambulatory Visit
Admission: RE | Admit: 2024-08-13 | Discharge: 2024-08-13 | Disposition: A | Discharge status: Home / Self Care | Source: Ambulatory Visit | Attending: Physician Assistant | Admitting: Physician Assistant

## 2024-08-13 ENCOUNTER — Encounter: Payer: Self-pay | Admitting: Physician Assistant

## 2024-08-13 DIAGNOSIS — W3400XA Accidental discharge from unspecified firearms or gun, initial encounter: Secondary | ICD-10-CM

## 2024-08-13 DIAGNOSIS — R0602 Shortness of breath: Secondary | ICD-10-CM
